# Patient Record
Sex: Female | Born: 1975
Health system: Southern US, Community
[De-identification: ages and names within clinical notes are randomized; demographics above are authoritative.]

## PROBLEM LIST (undated history)

## (undated) DIAGNOSIS — R51 Headache: Secondary | ICD-10-CM

## (undated) DIAGNOSIS — E785 Hyperlipidemia, unspecified: Secondary | ICD-10-CM

## (undated) DIAGNOSIS — E28319 Asymptomatic premature menopause: Secondary | ICD-10-CM

## (undated) HISTORY — PX: BREAST BIOPSY: SHX20

## (undated) HISTORY — DX: Hyperlipidemia, unspecified: E78.5

## (undated) HISTORY — PX: WISDOM TOOTH EXTRACTION: SHX21

## (undated) HISTORY — DX: Asymptomatic premature menopause: E28.319

---

## 2005-11-28 HISTORY — PX: DILATION AND CURETTAGE OF UTERUS: SHX78

## 2011-11-14 ENCOUNTER — Ambulatory Visit (HOSPITAL_COMMUNITY)
Admission: RE | Admit: 2011-11-14 | Discharge: 2011-11-14 | Disposition: A | Discharge status: Home / Self Care | Payer: BC Managed Care – PPO | Source: Ambulatory Visit | Attending: Obstetrics and Gynecology | Admitting: Obstetrics and Gynecology

## 2011-11-14 ENCOUNTER — Other Ambulatory Visit (HOSPITAL_COMMUNITY): Payer: Self-pay | Admitting: Obstetrics and Gynecology

## 2011-11-14 DIAGNOSIS — O021 Missed abortion: Secondary | ICD-10-CM

## 2011-11-15 ENCOUNTER — Encounter (HOSPITAL_COMMUNITY): Payer: Self-pay | Admitting: Pharmacist

## 2011-11-15 ENCOUNTER — Encounter (HOSPITAL_COMMUNITY): Payer: Self-pay | Admitting: *Deleted

## 2011-11-24 ENCOUNTER — Other Ambulatory Visit: Payer: Self-pay | Admitting: Obstetrics and Gynecology

## 2011-11-25 ENCOUNTER — Encounter (HOSPITAL_COMMUNITY): Payer: Self-pay | Admitting: *Deleted

## 2011-11-25 ENCOUNTER — Encounter (HOSPITAL_COMMUNITY): Payer: Self-pay | Admitting: Anesthesiology

## 2011-11-25 ENCOUNTER — Other Ambulatory Visit: Payer: Self-pay | Admitting: Obstetrics and Gynecology

## 2011-11-25 ENCOUNTER — Ambulatory Visit (HOSPITAL_COMMUNITY)
Admission: RE | Admit: 2011-11-25 | Discharge: 2011-11-25 | Disposition: A | Discharge status: Home / Self Care | Payer: BC Managed Care – PPO | Source: Ambulatory Visit | Attending: Obstetrics and Gynecology | Admitting: Obstetrics and Gynecology

## 2011-11-25 ENCOUNTER — Ambulatory Visit (HOSPITAL_COMMUNITY): Payer: BC Managed Care – PPO | Admitting: Anesthesiology

## 2011-11-25 ENCOUNTER — Encounter (HOSPITAL_COMMUNITY)
Admission: RE | Disposition: A | Discharge status: Home / Self Care | Payer: Self-pay | Source: Ambulatory Visit | Attending: Obstetrics and Gynecology

## 2011-11-25 DIAGNOSIS — O021 Missed abortion: Secondary | ICD-10-CM | POA: Insufficient documentation

## 2011-11-25 HISTORY — DX: Headache: R51

## 2011-11-25 HISTORY — PX: DILATION AND EVACUATION: SHX1459

## 2011-11-25 LAB — CBC
HCT: 36.7 % (ref 36.0–46.0)
Hemoglobin: 12.9 g/dL (ref 12.0–15.0)
RDW: 12.4 % (ref 11.5–15.5)
WBC: 8.2 10*3/uL (ref 4.0–10.5)

## 2011-11-25 SURGERY — DILATION AND EVACUATION, UTERUS
Anesthesia: Monitor Anesthesia Care | Wound class: Clean Contaminated

## 2011-11-25 MED ORDER — DOXYCYCLINE HYCLATE 100 MG PO TABS: 100.0000 mg | ORAL_TABLET | Freq: Once | ORAL | Status: DC

## 2011-11-25 MED ORDER — FENTANYL CITRATE 0.05 MG/ML IJ SOLN
INTRAMUSCULAR | Status: DC | PRN
  Administered 2011-11-25 (×2): 50 ug via INTRAVENOUS

## 2011-11-25 MED ORDER — LIDOCAINE HCL (CARDIAC) 20 MG/ML IV SOLN
INTRAVENOUS | Status: DC | PRN
  Administered 2011-11-25: 40 mg via INTRAVENOUS

## 2011-11-25 MED ORDER — FENTANYL CITRATE 0.05 MG/ML IJ SOLN
INTRAMUSCULAR | Status: AC
  Filled 2011-11-25: qty 2

## 2011-11-25 MED ORDER — PROPOFOL 10 MG/ML IV EMUL
INTRAVENOUS | Status: AC
  Filled 2011-11-25: qty 20

## 2011-11-25 MED ORDER — MIDAZOLAM HCL 2 MG/2ML IJ SOLN
INTRAMUSCULAR | Status: AC
  Filled 2011-11-25: qty 2

## 2011-11-25 MED ORDER — LACTATED RINGERS IV SOLN: INTRAVENOUS | Status: DC

## 2011-11-25 MED ORDER — DEXAMETHASONE SODIUM PHOSPHATE 10 MG/ML IJ SOLN
INTRAMUSCULAR | Status: AC
  Filled 2011-11-25: qty 1

## 2011-11-25 MED ORDER — LIDOCAINE HCL (CARDIAC) 20 MG/ML IV SOLN
INTRAVENOUS | Status: AC
  Filled 2011-11-25: qty 5

## 2011-11-25 MED ORDER — MIDAZOLAM HCL 5 MG/5ML IJ SOLN
INTRAMUSCULAR | Status: DC | PRN
  Administered 2011-11-25: 2 mg via INTRAVENOUS

## 2011-11-25 MED ORDER — ONDANSETRON HCL 4 MG/2ML IJ SOLN
INTRAMUSCULAR | Status: DC | PRN
  Administered 2011-11-25: 4 mg via INTRAVENOUS

## 2011-11-25 MED ORDER — PROPOFOL 10 MG/ML IV EMUL
INTRAVENOUS | Status: DC | PRN
  Administered 2011-11-25: 40 mg via INTRAVENOUS
  Administered 2011-11-25: 20 mg via INTRAVENOUS
  Administered 2011-11-25 (×3): 30 mg via INTRAVENOUS
  Administered 2011-11-25: 70 mg via INTRAVENOUS
  Administered 2011-11-25 (×2): 30 mg via INTRAVENOUS
  Administered 2011-11-25: 20 mg via INTRAVENOUS
  Administered 2011-11-25 (×4): 30 mg via INTRAVENOUS
  Administered 2011-11-25: 20 mg via INTRAVENOUS

## 2011-11-25 MED ORDER — LIDOCAINE HCL 1 % IJ SOLN
INTRAMUSCULAR | Status: DC | PRN
  Administered 2011-11-25: 10 mL

## 2011-11-25 MED ORDER — KETOROLAC TROMETHAMINE 30 MG/ML IJ SOLN
INTRAMUSCULAR | Status: DC | PRN
  Administered 2011-11-25: 30 mg via INTRAMUSCULAR

## 2011-11-25 MED ORDER — 0.9 % SODIUM CHLORIDE (POUR BTL) OPTIME
TOPICAL | Status: DC | PRN
  Administered 2011-11-25: 1000 mL

## 2011-11-25 MED ORDER — PROMETHAZINE HCL 25 MG/ML IJ SOLN: 6.2500 mg | INTRAMUSCULAR | Status: DC | PRN

## 2011-11-25 MED ORDER — PROPOFOL 10 MG/ML IV EMUL
INTRAVENOUS | Status: AC
  Filled 2011-11-25: qty 100

## 2011-11-25 MED ORDER — DEXAMETHASONE SODIUM PHOSPHATE 4 MG/ML IJ SOLN
INTRAMUSCULAR | Status: DC | PRN
  Administered 2011-11-25: 10 mg via INTRAVENOUS

## 2011-11-25 MED ORDER — DOXYCYCLINE HYCLATE 100 MG IV SOLR
200.0000 mg | Freq: Once | INTRAVENOUS | Status: AC
  Administered 2011-11-25: 200 mg via INTRAVENOUS
  Filled 2011-11-25: qty 200

## 2011-11-25 MED ORDER — FENTANYL CITRATE 0.05 MG/ML IJ SOLN: 25.0000 ug | INTRAMUSCULAR | Status: DC | PRN

## 2011-11-25 MED ORDER — KETOROLAC TROMETHAMINE 30 MG/ML IJ SOLN
INTRAMUSCULAR | Status: AC
  Filled 2011-11-25: qty 1

## 2011-11-25 MED ORDER — ACETAMINOPHEN 325 MG PO TABS: 325.0000 mg | ORAL_TABLET | ORAL | Status: DC | PRN

## 2011-11-25 MED ORDER — ONDANSETRON HCL 4 MG/2ML IJ SOLN
INTRAMUSCULAR | Status: AC
  Filled 2011-11-25: qty 2

## 2011-11-25 MED ORDER — LACTATED RINGERS IV SOLN
INTRAVENOUS | Status: DC
  Administered 2011-11-25 (×2): via INTRAVENOUS

## 2011-11-25 MED ORDER — KETOROLAC TROMETHAMINE 30 MG/ML IJ SOLN: 15.0000 mg | Freq: Once | INTRAMUSCULAR | Status: DC | PRN

## 2011-11-25 SURGICAL SUPPLY — 23 items
CATH ROBINSON RED A/P 16FR (CATHETERS) ×2 IMPLANT
CLOTH BEACON ORANGE TIMEOUT ST (SAFETY) ×2 IMPLANT
DECANTER SPIKE VIAL GLASS SM (MISCELLANEOUS) ×2 IMPLANT
DRAPE HYSTEROSCOPY (DRAPE) ×2 IMPLANT
GAUZE SPONGE 4X4 16PLY XRAY LF (GAUZE/BANDAGES/DRESSINGS) ×2 IMPLANT
GLOVE BIO SURGEON STRL SZ 6.5 (GLOVE) ×2 IMPLANT
GLOVE BIOGEL PI IND STRL 6.5 (GLOVE) ×2 IMPLANT
GLOVE BIOGEL PI INDICATOR 6.5 (GLOVE) ×2
GLOVE SURG SS PI 7.5 STRL IVOR (GLOVE) ×4 IMPLANT
GOWN PREVENTION PLUS LG XLONG (DISPOSABLE) ×4 IMPLANT
GOWN SURGICAL XLG (GOWNS) ×2 IMPLANT
KIT BERKELEY 1ST TRIMESTER 3/8 (MISCELLANEOUS) ×2 IMPLANT
NEEDLE SPNL 22GX3.5 QUINCKE BK (NEEDLE) ×2 IMPLANT
NS IRRIG 1000ML POUR BTL (IV SOLUTION) ×2 IMPLANT
PACK VAGINAL MINOR WOMEN LF (CUSTOM PROCEDURE TRAY) ×2 IMPLANT
PAD PREP 24X48 CUFFED NSTRL (MISCELLANEOUS) ×2 IMPLANT
SET BERKELEY SUCTION TUBING (SUCTIONS) ×2 IMPLANT
SYR CONTROL 10ML LL (SYRINGE) ×2 IMPLANT
TOWEL OR 17X24 6PK STRL BLUE (TOWEL DISPOSABLE) ×4 IMPLANT
VACURETTE 10 RIGID CVD (CANNULA) IMPLANT
VACURETTE 7MM CVD STRL WRAP (CANNULA) IMPLANT
VACURETTE 8 RIGID CVD (CANNULA) ×2 IMPLANT
VACURETTE 9 RIGID CVD (CANNULA) IMPLANT

## 2011-11-25 NOTE — Anesthesia Preprocedure Evaluation (Signed)
Anesthesia Evaluation  Patient identified by MRN, date of birth, ID band Patient awake    Reviewed: Allergy & Precautions, H&P , Patient's Chart, lab work & pertinent test results, reviewed documented beta blocker date and time   History of Anesthesia Complications Negative for: history of anesthetic complications  Airway Mallampati: II TM Distance: >3 FB Neck ROM: full    Dental No notable dental hx.    Pulmonary neg pulmonary ROS,  clear to auscultation  Pulmonary exam normal       Cardiovascular Exercise Tolerance: Good neg cardio ROS regular Normal    Neuro/Psych Negative Neurological ROS  Negative Psych ROS   GI/Hepatic negative GI ROS, Neg liver ROS,   Endo/Other  Negative Endocrine ROS  Renal/GU negative Renal ROS     Musculoskeletal   Abdominal   Peds  Hematology negative hematology ROS (+)   Anesthesia Other Findings   Reproductive/Obstetrics negative OB ROS                           Anesthesia Physical Anesthesia Plan  ASA: I  Anesthesia Plan: MAC   Post-op Pain Management:    Induction:   Airway Management Planned:   Additional Equipment:   Intra-op Plan:   Post-operative Plan:   Informed Consent: I have reviewed the patients History and Physical, chart, labs and discussed the procedure including the risks, benefits and alternatives for the proposed anesthesia with the patient or authorized representative who has indicated his/her understanding and acceptance.   Dental Advisory Given  Plan Discussed with: CRNA, Surgeon and Anesthesiologist  Anesthesia Plan Comments:         Anesthesia Quick Evaluation  

## 2011-11-25 NOTE — Transfer of Care (Signed)
Immediate Anesthesia Transfer of Care Note  Patient: Kristy Stone  Procedure(s) Performed:  DILATATION AND EVACUATION - dilatation and curettage  Patient Location: PACU  Anesthesia Type: MAC  Level of Consciousness: awake, alert , oriented and patient cooperative  Airway & Oxygen Therapy: Patient Spontanous Breathing and Patient connected to nasal cannula oxygen  Post-op Assessment: Report given to PACU RN and Post -op Vital signs reviewed and stable  Post vital signs: Reviewed and stable  Complications: No apparent anesthesia complications

## 2011-11-25 NOTE — H&P (Signed)
35 y.o. yo complains of MAB ~8 wk, desiring definitive mgmt.  No bleeding.  Past Medical History  Diagnosis Date  . Headache    Past Surgical History  Procedure Date  . Cesarean section x2  . Dilation and curettage of uterus 2007    History   Social History  . Marital Status: Married    Spouse Name: N/A    Number of Children: N/A  . Years of Education: N/A   Occupational History  . Not on file.   Social History Main Topics  . Smoking status: Former Games developer  . Smokeless tobacco: Not on file  . Alcohol Use: Yes  . Drug Use: No  . Sexually Active:    Other Topics Concern  . Not on file   Social History Narrative  . No narrative on file    No current facility-administered medications on file prior to encounter.   No current outpatient prescriptions on file prior to encounter.    No Known Allergies  @VITALS2 @  Lungs: clear to ascultation Cor:  RRR Abdomen:  soft, nontender, nondistended. Ex:  no cords, erythema Pelvic:  Deferred to OR  A:  35yo with MAB   P:  D&E  All risks, benefits and alternatives d/w patient and she desires to proceed with a D&E . Pt to receive 200 IV doxycycline pre-op.  Rx for 100mg  po doxycycline given to be taken 12 hrs post-op.    Philip Aspen

## 2011-11-25 NOTE — Discharge Summary (Signed)
  Admitted for same day procedure. Underwent uncomplicated D&E Rx doxycycline 100 mg po 12 hrs post-op. F/u 2 weeks.

## 2011-11-25 NOTE — Anesthesia Postprocedure Evaluation (Signed)
Anesthesia Post Note  Patient: Kristy Stone  Procedure(s) Performed:  DILATATION AND EVACUATION - dilatation and curettage  Anesthesia type: MAC  Patient location: PACU  Post pain: Pain level controlled  Post assessment: Post-op Vital signs reviewed  Last Vitals:  Filed Vitals:   11/25/11 1210  BP: 95/75  Pulse: 86  Temp: 36.9 C  Resp: 18    Post vital signs: Reviewed  Level of consciousness: sedated  Complications: No apparent anesthesia complications

## 2011-11-25 NOTE — Op Note (Signed)
Preop: 9 wk MAB, no FP Postop: same Procedure: D&E Surgeon: Delray Alt Anethesia: MAC, 10 cc 1% lido Fluids: see Anesth report UOP: cath <200 EBL: 300 Specimen: POC Complications: None Condition: stable to PACU

## 2011-11-25 NOTE — Preoperative (Signed)
Beta Blockers   Reason not to administer Beta Blockers:Not Applicable 

## 2011-11-28 ENCOUNTER — Encounter (HOSPITAL_COMMUNITY): Payer: Self-pay | Admitting: Obstetrics and Gynecology

## 2011-11-29 DEATH — deceased

## 2011-12-03 NOTE — Op Note (Signed)
Kristy Stone, Kristy Stone             ACCOUNT NO.:  [id number]  MEDICAL RECORD NO.:  [id number]  LOCATION:  [facility]                          FACILITY:  [facility]  PHYSICIAN:  Philip Aspen, DO    DATE OF BIRTH:  November 24, 1976  DATE OF PROCEDURE:  12/02/2011 DATE OF DISCHARGE:  11/25/2011                              OPERATIVE REPORT   PREOPERATIVE DIAGNOSIS:  Nine-week missed abortion, no fetal pole.  POSTOPERATIVE DIAGNOSIS:  Nine-week missed abortion, no fetal pole.  PROCEDURE:  Dilation and evacuation.  SURGEON:  Philip Aspen, DO  ANESTHESIA:  MAC, 10 mL of 1% lidocaine.  FLUIDS:  Please seen anesthesia report.  URINE OUTPUT:  Cathed for less than 200 mL.  ESTIMATED BLOOD LOSS:  300 mL.  SPECIMENS:  Products of conception.  COMPLICATION:  None.  CONDITION:  Stable to PACU.  DESCRIPTION OF PROCEDURE:  The patient was taken to the operating room where MAC sedation was administered and found to be adequate.  She was then prepped and draped in a normal sterile fashion in the dorsal lithotomy position.  A weighted speculum was placed in the posterior aspect of the vagina and a single toothed tenaculum was used to grasp the anterior lip of the cervix.  The cervical os was sterilely dilated to approximately 27 Pratt.  A 9 curved curette was gently introduced through the cervical os to the fundus and vacuum was then applied. Three passes were made in the fascia and with return of products of conception.  A vessel at the cervical os intermittently bled briskly during the procedure.  Three passes of a curved curette were performed, sharp curettage.  Remaining pass of the vacuum curette was done with minimal additional bleeding.  The vessel at the external cervix at that point had stopped bleeding.  Good uterine cry had been noted in all quadrants.  Products of conception were sent to the Pathology.  Single tipped tenaculum was removed and cervix was found to be hemostatic.  All other  instruments were removed.  Of note, 10 mL of 1% lidocaine had been administered prior to the start of the procedure for cervical block. The patient tolerated the procedure well.  Sponge, lap, and needle counts were correct x2.  The patient was taken to recovery in stable condition.  The patient did receive 200 mg of IV doxycycline at the start of the procedure and was given a prescription for 100 mg of oral doxycycline to be taken 12 hours after.  The patient was instructed to follow up with physician in 2 weeks or sooner with any issues.          ______________________________ Philip Aspen, DO     [place]/MEDQ  D:  12/02/2011  T:  12/03/2011  Job:  [id number]

## 2012-01-24 ENCOUNTER — Other Ambulatory Visit: Payer: Self-pay

## 2016-09-11 DIAGNOSIS — Z23 Encounter for immunization: Secondary | ICD-10-CM | POA: Diagnosis not present

## 2016-09-19 ENCOUNTER — Other Ambulatory Visit: Payer: Self-pay | Admitting: Obstetrics and Gynecology

## 2016-09-19 DIAGNOSIS — E2839 Other primary ovarian failure: Secondary | ICD-10-CM

## 2016-09-19 DIAGNOSIS — E288 Other ovarian dysfunction: Principal | ICD-10-CM

## 2016-09-27 ENCOUNTER — Ambulatory Visit
Admission: RE | Admit: 2016-09-27 | Discharge: 2016-09-27 | Disposition: A | Discharge status: Home / Self Care | Payer: BLUE CROSS/BLUE SHIELD | Source: Ambulatory Visit | Attending: Obstetrics and Gynecology | Admitting: Obstetrics and Gynecology

## 2016-09-27 DIAGNOSIS — E288 Other ovarian dysfunction: Principal | ICD-10-CM

## 2016-09-27 DIAGNOSIS — Z13820 Encounter for screening for osteoporosis: Secondary | ICD-10-CM | POA: Diagnosis not present

## 2016-09-27 DIAGNOSIS — E2839 Other primary ovarian failure: Secondary | ICD-10-CM

## 2016-09-27 DIAGNOSIS — Z780 Asymptomatic menopausal state: Secondary | ICD-10-CM | POA: Diagnosis not present

## 2017-04-21 DIAGNOSIS — J0100 Acute maxillary sinusitis, unspecified: Secondary | ICD-10-CM | POA: Diagnosis not present

## 2017-08-18 DIAGNOSIS — Z23 Encounter for immunization: Secondary | ICD-10-CM | POA: Diagnosis not present

## 2017-09-09 DIAGNOSIS — N3001 Acute cystitis with hematuria: Secondary | ICD-10-CM | POA: Diagnosis not present

## 2017-09-28 HISTORY — PX: APPENDECTOMY: SHX54

## 2017-10-27 ENCOUNTER — Other Ambulatory Visit: Payer: Self-pay

## 2017-10-27 ENCOUNTER — Emergency Department (HOSPITAL_COMMUNITY): Payer: BLUE CROSS/BLUE SHIELD

## 2017-10-27 ENCOUNTER — Observation Stay (HOSPITAL_COMMUNITY)
Admission: EM | Admit: 2017-10-27 | Discharge: 2017-10-28 | Disposition: A | Discharge status: Home / Self Care | Payer: BLUE CROSS/BLUE SHIELD | Attending: General Surgery | Admitting: General Surgery

## 2017-10-27 ENCOUNTER — Emergency Department (HOSPITAL_COMMUNITY): Payer: BLUE CROSS/BLUE SHIELD | Admitting: Certified Registered Nurse Anesthetist

## 2017-10-27 ENCOUNTER — Encounter (HOSPITAL_COMMUNITY)
Admission: EM | Disposition: A | Discharge status: Home / Self Care | Payer: Self-pay | Source: Home / Self Care | Attending: Emergency Medicine

## 2017-10-27 ENCOUNTER — Encounter (HOSPITAL_COMMUNITY): Payer: Self-pay | Admitting: Emergency Medicine

## 2017-10-27 DIAGNOSIS — R1031 Right lower quadrant pain: Secondary | ICD-10-CM | POA: Diagnosis not present

## 2017-10-27 DIAGNOSIS — Z87891 Personal history of nicotine dependence: Secondary | ICD-10-CM | POA: Insufficient documentation

## 2017-10-27 DIAGNOSIS — K381 Appendicular concretions: Secondary | ICD-10-CM | POA: Insufficient documentation

## 2017-10-27 DIAGNOSIS — K353 Acute appendicitis with localized peritonitis, without perforation or gangrene: Principal | ICD-10-CM | POA: Insufficient documentation

## 2017-10-27 DIAGNOSIS — Z9049 Acquired absence of other specified parts of digestive tract: Secondary | ICD-10-CM

## 2017-10-27 DIAGNOSIS — R109 Unspecified abdominal pain: Secondary | ICD-10-CM | POA: Diagnosis not present

## 2017-10-27 DIAGNOSIS — K3580 Unspecified acute appendicitis: Secondary | ICD-10-CM | POA: Diagnosis not present

## 2017-10-27 DIAGNOSIS — R1030 Lower abdominal pain, unspecified: Secondary | ICD-10-CM | POA: Diagnosis not present

## 2017-10-27 DIAGNOSIS — R1110 Vomiting, unspecified: Secondary | ICD-10-CM | POA: Diagnosis not present

## 2017-10-27 HISTORY — PX: LAPAROSCOPIC APPENDECTOMY: SHX408

## 2017-10-27 LAB — CBC
HEMATOCRIT: 36.6 % (ref 36.0–46.0)
Hemoglobin: 12.6 g/dL (ref 12.0–15.0)
MCH: 31.3 pg (ref 26.0–34.0)
MCHC: 34.4 g/dL (ref 30.0–36.0)
MCV: 90.8 fL (ref 78.0–100.0)
PLATELETS: 240 10*3/uL (ref 150–400)
RBC: 4.03 MIL/uL (ref 3.87–5.11)
RDW: 11.8 % (ref 11.5–15.5)
WBC: 13.8 10*3/uL — AB (ref 4.0–10.5)

## 2017-10-27 LAB — WET PREP, GENITAL
Clue Cells Wet Prep HPF POC: NONE SEEN
Sperm: NONE SEEN
TRICH WET PREP: NONE SEEN
YEAST WET PREP: NONE SEEN

## 2017-10-27 LAB — COMPREHENSIVE METABOLIC PANEL
ALT: 28 U/L (ref 14–54)
AST: 21 U/L (ref 15–41)
Albumin: 4.1 g/dL (ref 3.5–5.0)
Alkaline Phosphatase: 56 U/L (ref 38–126)
Anion gap: 8 (ref 5–15)
BILIRUBIN TOTAL: 0.7 mg/dL (ref 0.3–1.2)
BUN: 12 mg/dL (ref 6–20)
CHLORIDE: 102 mmol/L (ref 101–111)
CO2: 24 mmol/L (ref 22–32)
CREATININE: 0.73 mg/dL (ref 0.44–1.00)
Calcium: 9.2 mg/dL (ref 8.9–10.3)
Glucose, Bld: 137 mg/dL — ABNORMAL HIGH (ref 65–99)
POTASSIUM: 3.7 mmol/L (ref 3.5–5.1)
Sodium: 134 mmol/L — ABNORMAL LOW (ref 135–145)
TOTAL PROTEIN: 6.5 g/dL (ref 6.5–8.1)

## 2017-10-27 LAB — I-STAT BETA HCG BLOOD, ED (MC, WL, AP ONLY): I-stat hCG, quantitative: <5 m[IU]/mL (ref <5)

## 2017-10-27 LAB — URINALYSIS, ROUTINE W REFLEX MICROSCOPIC
Bilirubin Urine: NEGATIVE
Glucose, UA: NEGATIVE mg/dL
Hgb urine dipstick: NEGATIVE
KETONES UR: 20 mg/dL — AB
LEUKOCYTES UA: NEGATIVE
NITRITE: NEGATIVE
PROTEIN: NEGATIVE mg/dL
Specific Gravity, Urine: 1.019 (ref 1.005–1.030)
pH: 6 (ref 5.0–8.0)

## 2017-10-27 LAB — LIPASE, BLOOD: LIPASE: 22 U/L (ref 11–51)

## 2017-10-27 SURGERY — APPENDECTOMY, LAPAROSCOPIC
Anesthesia: General | Site: Abdomen

## 2017-10-27 MED ORDER — KETOROLAC TROMETHAMINE 30 MG/ML IJ SOLN: 30.0000 mg | Freq: Four times a day (QID) | INTRAMUSCULAR | Status: DC | PRN

## 2017-10-27 MED ORDER — SUGAMMADEX SODIUM 200 MG/2ML IV SOLN
INTRAVENOUS | Status: DC | PRN
  Administered 2017-10-27: 150 mg via INTRAVENOUS

## 2017-10-27 MED ORDER — MORPHINE SULFATE (PF) 4 MG/ML IV SOLN
4.0000 mg | Freq: Once | INTRAVENOUS | Status: AC
  Administered 2017-10-27: 4 mg via INTRAVENOUS
  Filled 2017-10-27: qty 1

## 2017-10-27 MED ORDER — DEXTROSE-NACL 5-0.9 % IV SOLN
INTRAVENOUS | Status: DC
  Administered 2017-10-27: 20:00:00 via INTRAVENOUS

## 2017-10-27 MED ORDER — HYDROMORPHONE HCL 1 MG/ML IJ SOLN: 1.0000 mg | INTRAMUSCULAR | Status: DC | PRN

## 2017-10-27 MED ORDER — ROCURONIUM BROMIDE 10 MG/ML (PF) SYRINGE
PREFILLED_SYRINGE | INTRAVENOUS | Status: AC
  Filled 2017-10-27: qty 5

## 2017-10-27 MED ORDER — PHENYLEPHRINE 40 MCG/ML (10ML) SYRINGE FOR IV PUSH (FOR BLOOD PRESSURE SUPPORT)
PREFILLED_SYRINGE | INTRAVENOUS | Status: AC
  Filled 2017-10-27: qty 10

## 2017-10-27 MED ORDER — IOPAMIDOL (ISOVUE-300) INJECTION 61%
INTRAVENOUS | Status: AC
  Administered 2017-10-27: 100 mL
  Filled 2017-10-27: qty 100

## 2017-10-27 MED ORDER — PROMETHAZINE HCL 25 MG/ML IJ SOLN: 6.2500 mg | INTRAMUSCULAR | Status: DC | PRN

## 2017-10-27 MED ORDER — SODIUM CHLORIDE 0.9 % IV BOLUS (SEPSIS)
1000.0000 mL | Freq: Once | INTRAVENOUS | Status: AC
Start: 2017-10-27 — End: 2017-10-27
  Administered 2017-10-27: 1000 mL via INTRAVENOUS

## 2017-10-27 MED ORDER — SCOPOLAMINE 1 MG/3DAYS TD PT72
MEDICATED_PATCH | TRANSDERMAL | Status: DC | PRN
  Administered 2017-10-27: 1 via TRANSDERMAL

## 2017-10-27 MED ORDER — PIPERACILLIN-TAZOBACTAM 3.375 G IVPB 30 MIN
3.3750 g | Freq: Once | INTRAVENOUS | Status: AC
  Administered 2017-10-27: 3.375 g via INTRAVENOUS
  Filled 2017-10-27: qty 50

## 2017-10-27 MED ORDER — GLYCOPYRROLATE 0.2 MG/ML IJ SOLN
INTRAMUSCULAR | Status: DC | PRN
  Administered 2017-10-27: 0.2 mg via INTRAVENOUS

## 2017-10-27 MED ORDER — PROPOFOL 10 MG/ML IV BOLUS
INTRAVENOUS | Status: DC | PRN
  Administered 2017-10-27: 150 mg via INTRAVENOUS

## 2017-10-27 MED ORDER — FENTANYL CITRATE (PF) 100 MCG/2ML IJ SOLN
INTRAMUSCULAR | Status: DC | PRN
  Administered 2017-10-27 (×2): 50 ug via INTRAVENOUS
  Administered 2017-10-27: 100 ug via INTRAVENOUS

## 2017-10-27 MED ORDER — ONDANSETRON HCL 4 MG/2ML IJ SOLN: 4.0000 mg | Freq: Four times a day (QID) | INTRAMUSCULAR | Status: DC | PRN

## 2017-10-27 MED ORDER — OXYCODONE HCL 5 MG PO TABS: 5.0000 mg | ORAL_TABLET | ORAL | Status: DC | PRN

## 2017-10-27 MED ORDER — BUPIVACAINE HCL (PF) 0.25 % IJ SOLN
INTRAMUSCULAR | Status: AC
  Filled 2017-10-27: qty 20

## 2017-10-27 MED ORDER — ROCURONIUM BROMIDE 100 MG/10ML IV SOLN
INTRAVENOUS | Status: DC | PRN
  Administered 2017-10-27: 30 mg via INTRAVENOUS

## 2017-10-27 MED ORDER — ONDANSETRON 4 MG PO TBDP
4.0000 mg | ORAL_TABLET | Freq: Once | ORAL | Status: AC | PRN
  Administered 2017-10-27: 4 mg via ORAL
  Filled 2017-10-27: qty 1

## 2017-10-27 MED ORDER — 0.9 % SODIUM CHLORIDE (POUR BTL) OPTIME
TOPICAL | Status: DC | PRN
  Administered 2017-10-27 (×2): 1000 mL

## 2017-10-27 MED ORDER — MIDAZOLAM HCL 2 MG/2ML IJ SOLN
INTRAMUSCULAR | Status: AC
  Filled 2017-10-27: qty 2

## 2017-10-27 MED ORDER — LACTATED RINGERS IV SOLN: INTRAVENOUS | Status: DC

## 2017-10-27 MED ORDER — MEPERIDINE HCL 25 MG/ML IJ SOLN: 6.2500 mg | INTRAMUSCULAR | Status: DC | PRN

## 2017-10-27 MED ORDER — LIDOCAINE HCL (CARDIAC) 20 MG/ML IV SOLN
INTRAVENOUS | Status: DC | PRN
Start: 2017-10-27 — End: 2017-10-27
  Administered 2017-10-27: 60 mg via INTRAVENOUS

## 2017-10-27 MED ORDER — ONDANSETRON 4 MG PO TBDP: 4.0000 mg | ORAL_TABLET | Freq: Four times a day (QID) | ORAL | Status: DC | PRN

## 2017-10-27 MED ORDER — FENTANYL CITRATE (PF) 250 MCG/5ML IJ SOLN
INTRAMUSCULAR | Status: AC
  Filled 2017-10-27: qty 5

## 2017-10-27 MED ORDER — LACTATED RINGERS IV SOLN
INTRAVENOUS | Status: DC
  Administered 2017-10-27: 18:00:00 via INTRAVENOUS

## 2017-10-27 MED ORDER — SUCCINYLCHOLINE CHLORIDE 20 MG/ML IJ SOLN
INTRAMUSCULAR | Status: DC | PRN
  Administered 2017-10-27: 4 mg via INTRAVENOUS
  Administered 2017-10-27: 80 mg via INTRAVENOUS

## 2017-10-27 MED ORDER — MIDAZOLAM HCL 5 MG/5ML IJ SOLN
INTRAMUSCULAR | Status: DC | PRN
  Administered 2017-10-27: 2 mg via INTRAVENOUS

## 2017-10-27 MED ORDER — LACTATED RINGERS IV SOLN
INTRAVENOUS | Status: DC | PRN
  Administered 2017-10-27: 18:00:00 via INTRAVENOUS

## 2017-10-27 MED ORDER — SUCCINYLCHOLINE CHLORIDE 200 MG/10ML IV SOSY
PREFILLED_SYRINGE | INTRAVENOUS | Status: AC
  Filled 2017-10-27: qty 10

## 2017-10-27 MED ORDER — SODIUM CHLORIDE 0.9 % IR SOLN
Status: DC | PRN
  Administered 2017-10-27: 1000 mL

## 2017-10-27 MED ORDER — DEXAMETHASONE SODIUM PHOSPHATE 10 MG/ML IJ SOLN
INTRAMUSCULAR | Status: AC
  Filled 2017-10-27: qty 1

## 2017-10-27 MED ORDER — SUGAMMADEX SODIUM 200 MG/2ML IV SOLN
INTRAVENOUS | Status: AC
  Filled 2017-10-27: qty 4

## 2017-10-27 MED ORDER — DEXAMETHASONE SODIUM PHOSPHATE 4 MG/ML IJ SOLN
INTRAMUSCULAR | Status: DC | PRN
  Administered 2017-10-27: 8 mg via INTRAVENOUS

## 2017-10-27 MED ORDER — PROPOFOL 10 MG/ML IV BOLUS
INTRAVENOUS | Status: AC
  Filled 2017-10-27: qty 20

## 2017-10-27 MED ORDER — BUPIVACAINE HCL 0.25 % IJ SOLN
INTRAMUSCULAR | Status: DC | PRN
Start: 2017-10-27 — End: 2017-10-27
  Administered 2017-10-27: 2 mL

## 2017-10-27 MED ORDER — HYDROMORPHONE HCL 1 MG/ML IJ SOLN: 0.2500 mg | INTRAMUSCULAR | Status: DC | PRN

## 2017-10-27 MED ORDER — ONDANSETRON HCL 4 MG/2ML IJ SOLN
4.0000 mg | Freq: Once | INTRAMUSCULAR | Status: AC
  Administered 2017-10-27: 4 mg via INTRAVENOUS
  Filled 2017-10-27: qty 2

## 2017-10-27 MED ORDER — KETOROLAC TROMETHAMINE 30 MG/ML IJ SOLN
30.0000 mg | Freq: Four times a day (QID) | INTRAMUSCULAR | Status: DC
  Administered 2017-10-27 – 2017-10-28 (×2): 30 mg via INTRAVENOUS
  Filled 2017-10-27 (×2): qty 1

## 2017-10-27 SURGICAL SUPPLY — 47 items
APPLIER CLIP 5 13 M/L LIGAMAX5 (MISCELLANEOUS)
BENZOIN TINCTURE PRP APPL 2/3 (GAUZE/BANDAGES/DRESSINGS) ×3 IMPLANT
BLADE CLIPPER SURG (BLADE) IMPLANT
CANISTER SUCT 3000ML PPV (MISCELLANEOUS) ×3 IMPLANT
CHLORAPREP W/TINT 26ML (MISCELLANEOUS) ×3 IMPLANT
CLIP APPLIE 5 13 M/L LIGAMAX5 (MISCELLANEOUS) IMPLANT
CLOSURE WOUND 1/2 X4 (GAUZE/BANDAGES/DRESSINGS) ×1
CONT SPEC 4OZ CLIKSEAL STRL BL (MISCELLANEOUS) ×3 IMPLANT
COVER SURGICAL LIGHT HANDLE (MISCELLANEOUS) ×3 IMPLANT
COVER TRANSDUCER ULTRASND (DRAPES) ×6 IMPLANT
ELECT REM PT RETURN 9FT ADLT (ELECTROSURGICAL) ×3
ELECTRODE REM PT RTRN 9FT ADLT (ELECTROSURGICAL) ×1 IMPLANT
ENDOLOOP SUT PDS II  0 18 (SUTURE) ×12
ENDOLOOP SUT PDS II 0 18 (SUTURE) ×6 IMPLANT
GAUZE SPONGE 2X2 8PLY STRL LF (GAUZE/BANDAGES/DRESSINGS) ×1 IMPLANT
GLOVE BIO SURGEON STRL SZ 6.5 (GLOVE) ×2 IMPLANT
GLOVE BIO SURGEON STRL SZ7.5 (GLOVE) ×3 IMPLANT
GLOVE BIO SURGEONS STRL SZ 6.5 (GLOVE) ×1
GLOVE BIOGEL PI IND STRL 6.5 (GLOVE) ×1 IMPLANT
GLOVE BIOGEL PI INDICATOR 6.5 (GLOVE) ×2
GLOVE SURG SS PI 6.5 STRL IVOR (GLOVE) ×3 IMPLANT
GOWN STRL REUS W/ TWL LRG LVL3 (GOWN DISPOSABLE) ×2 IMPLANT
GOWN STRL REUS W/ TWL XL LVL3 (GOWN DISPOSABLE) ×1 IMPLANT
GOWN STRL REUS W/TWL LRG LVL3 (GOWN DISPOSABLE) ×4
GOWN STRL REUS W/TWL XL LVL3 (GOWN DISPOSABLE) ×2
GRASPER SUT TROCAR 14GX15 (MISCELLANEOUS) ×3 IMPLANT
KIT BASIN OR (CUSTOM PROCEDURE TRAY) ×3 IMPLANT
KIT ROOM TURNOVER OR (KITS) ×3 IMPLANT
NEEDLE INSUFFLATION 14GA 120MM (NEEDLE) ×3 IMPLANT
NS IRRIG 1000ML POUR BTL (IV SOLUTION) ×3 IMPLANT
PAD ARMBOARD 7.5X6 YLW CONV (MISCELLANEOUS) ×6 IMPLANT
POUCH RETRIEVAL ECOSAC 10 (ENDOMECHANICALS) ×1 IMPLANT
POUCH RETRIEVAL ECOSAC 10MM (ENDOMECHANICALS) ×2
SCISSORS LAP 5X35 DISP (ENDOMECHANICALS) ×3 IMPLANT
SET IRRIG TUBING LAPAROSCOPIC (IRRIGATION / IRRIGATOR) ×3 IMPLANT
SLEEVE ENDOPATH XCEL 5M (ENDOMECHANICALS) ×6 IMPLANT
SPECIMEN JAR SMALL (MISCELLANEOUS) ×3 IMPLANT
SPONGE GAUZE 2X2 STER 10/PKG (GAUZE/BANDAGES/DRESSINGS) ×2
STRIP CLOSURE SKIN 1/2X4 (GAUZE/BANDAGES/DRESSINGS) ×2 IMPLANT
SUT MNCRL AB 4-0 PS2 18 (SUTURE) ×3 IMPLANT
TOWEL OR 17X24 6PK STRL BLUE (TOWEL DISPOSABLE) ×3 IMPLANT
TOWEL OR 17X26 10 PK STRL BLUE (TOWEL DISPOSABLE) ×3 IMPLANT
TRAY FOLEY CATH SILVER 16FR (SET/KITS/TRAYS/PACK) ×3 IMPLANT
TRAY LAPAROSCOPIC MC (CUSTOM PROCEDURE TRAY) ×3 IMPLANT
TROCAR XCEL NON-BLD 11X100MML (ENDOMECHANICALS) ×3 IMPLANT
TROCAR XCEL NON-BLD 5MMX100MML (ENDOMECHANICALS) ×3 IMPLANT
TUBING INSUFFLATION (TUBING) ×3 IMPLANT

## 2017-10-27 NOTE — H&P (Signed)
Kristy Stone is an 41 y.o. female.   Chief Complaint: Abdominal pain HPI: Patient is a 41 year old female with generalized pain that began at 2 AM. She states that the pain continued to increase. She states that the pain slowly localized to the right lower quadrant. She states that she also had some nausea, vomiting.  She states that she also had some diarrhea at the time however this has resolved.  Patient had continued increased abdominal pain and thus presented to the ER for further evaluation.  Upon evaluation in ER she underwent CT scan which shows signs consistent with acute appendicitis.  I reviewed the studies myself.  Patient also had laboratory studies with elevated WBC count.  General surgery was consulted for further evaluation and management. Past Medical History:  Diagnosis Date  . [id number](932.6)     Past Surgical History:  Procedure Laterality Date  . CESAREAN SECTION  x2  . DILATION AND CURETTAGE OF UTERUS  2007  . DILATION AND EVACUATION  11/25/2011   Procedure: DILATATION AND EVACUATION;  Surgeon: Allyn Kenner, DO;  Location: [facility];  Service: Gynecology;  Laterality: N/A;  dilatation and curettage    History reviewed. No pertinent family history. Social History:  reports that she has quit smoking. she has never used smokeless tobacco. She reports that she drinks alcohol. She reports that she does not use drugs.  Allergies: No Known Allergies   (Not in a hospital admission)  Results for orders placed or performed during the hospital encounter of 10/27/17 (from the past 48 hour(s))  Lipase, blood     Status: None   Collection Time: 10/27/17 11:04 AM  Result Value Ref Range   Lipase 22 11 - 51 U/L  Comprehensive metabolic panel     Status: Abnormal   Collection Time: 10/27/17 11:04 AM  Result Value Ref Range   Sodium 134 (L) 135 - 145 mmol/L   Potassium 3.7 3.5 - 5.1 mmol/L   Chloride 102 101 - 111 mmol/L   CO2 24 22 - 32 mmol/L   Glucose, Bld 137 (H) 65  - 99 mg/dL   BUN 12 6 - 20 mg/dL   Creatinine, Ser 0.73 0.44 - 1.00 mg/dL   Calcium 9.2 8.9 - 10.3 mg/dL   Total Protein 6.5 6.5 - 8.1 g/dL   Albumin 4.1 3.5 - 5.0 g/dL   AST 21 15 - 41 U/L   ALT 28 14 - 54 U/L   Alkaline Phosphatase 56 38 - 126 U/L   Total Bilirubin 0.7 0.3 - 1.2 mg/dL   GFR calc non Af Amer >60 >60 mL/min   GFR calc Af Amer >60 >60 mL/min    Comment: (NOTE) The eGFR has been calculated using the CKD EPI equation. This calculation has not been validated in all clinical situations. eGFR's persistently <60 mL/min signify possible Chronic Kidney Disease.    Anion gap 8 5 - 15  CBC     Status: Abnormal   Collection Time: 10/27/17 11:04 AM  Result Value Ref Range   WBC 13.8 (H) 4.0 - 10.5 K/uL   RBC 4.03 3.87 - 5.11 MIL/uL   Hemoglobin 12.6 12.0 - 15.0 g/dL   HCT 36.6 36.0 - 46.0 %   MCV 90.8 78.0 - 100.0 fL   MCH 31.3 26.0 - 34.0 pg   MCHC 34.4 30.0 - 36.0 g/dL   RDW 11.8 11.5 - 15.5 %   Platelets 240 150 - 400 K/uL  I-Stat beta hCG blood, ED  Status: None   Collection Time: 10/27/17 11:20 AM  Result Value Ref Range   I-stat hCG, quantitative <5.0 <5 mIU/mL   Comment 3            Comment:   GEST. AGE      CONC.  (mIU/mL)   <=1 WEEK        5 - 50     2 WEEKS       50 - 500     3 WEEKS       100 - 10,000     4 WEEKS     1,000 - 30,000        FEMALE AND NON-PREGNANT FEMALE:     LESS THAN 5 mIU/mL   Wet prep, genital     Status: Abnormal   Collection Time: 10/27/17  2:44 PM  Result Value Ref Range   Yeast Wet Prep HPF POC NONE SEEN NONE SEEN   Trich, Wet Prep NONE SEEN NONE SEEN   Clue Cells Wet Prep HPF POC NONE SEEN NONE SEEN   WBC, Wet Prep HPF POC FEW (A) NONE SEEN   Sperm NONE SEEN   Urinalysis, Routine w reflex microscopic     Status: Abnormal   Collection Time: 10/27/17  2:48 PM  Result Value Ref Range   Color, Urine YELLOW YELLOW   APPearance CLEAR CLEAR   Specific Gravity, Urine 1.019 1.005 - 1.030   pH 6.0 5.0 - 8.0   Glucose, UA  NEGATIVE NEGATIVE mg/dL   Hgb urine dipstick NEGATIVE NEGATIVE   Bilirubin Urine NEGATIVE NEGATIVE   Ketones, ur 20 (A) NEGATIVE mg/dL   Protein, ur NEGATIVE NEGATIVE mg/dL   Nitrite NEGATIVE NEGATIVE   Leukocytes, UA NEGATIVE NEGATIVE   US Transvaginal Non-ob  Result Date: 10/27/2017 CLINICAL DATA:  Right lower quadrant pain EXAM: TRANSABDOMINAL AND TRANSVAGINAL ULTRASOUND OF PELVIS DOPPLER ULTRASOUND OF OVARIES TECHNIQUE: Both transabdominal and transvaginal ultrasound examinations of the pelvis were performed. Transabdominal technique was performed for global imaging of the pelvis including uterus, ovaries, adnexal regions, and pelvic cul-de-sac. It was necessary to proceed with endovaginal exam following the transabdominal exam to visualize the ovaries. Color and duplex Doppler ultrasound was utilized to evaluate blood flow to the ovaries. COMPARISON:  None. FINDINGS: Uterus Measurements: 6.3 x 2.9 x 4.3 cm. No fibroids or other mass visualized. Endometrium Thickness: 1 mm in thickness.  No focal abnormality visualized. Right ovary Measurements: 2.2 x 0.8 x 1.6 cm. Normal appearance. Prominent tubular structures in the right adnexa are dilated pelvic veins. Left ovary Measurements: 1.9 x 1.2 x 1.5 cm. Normal appearance/no adnexal mass. Pulsed Doppler evaluation of both ovaries demonstrates normal low-resistance arterial and venous waveforms. Other findings No abnormal free fluid. IMPRESSION: No evidence of ovarian torsion or ovarian abnormality. Dilated pelvic veins in the right adnexal region compatible with parapelvic varicosities. Electronically Signed   By: Rolm Baptise M.D.   On: 10/27/2017 16:15   US Pelvis Complete  Result Date: 10/27/2017 CLINICAL DATA:  Right lower quadrant pain EXAM: TRANSABDOMINAL AND TRANSVAGINAL ULTRASOUND OF PELVIS DOPPLER ULTRASOUND OF OVARIES TECHNIQUE: Both transabdominal and transvaginal ultrasound examinations of the pelvis were performed. Transabdominal  technique was performed for global imaging of the pelvis including uterus, ovaries, adnexal regions, and pelvic cul-de-sac. It was necessary to proceed with endovaginal exam following the transabdominal exam to visualize the ovaries. Color and duplex Doppler ultrasound was utilized to evaluate blood flow to the ovaries. COMPARISON:  None. FINDINGS: Uterus Measurements: 6.3 x 2.9  x 4.3 cm. No fibroids or other mass visualized. Endometrium Thickness: 1 mm in thickness.  No focal abnormality visualized. Right ovary Measurements: 2.2 x 0.8 x 1.6 cm. Normal appearance. Prominent tubular structures in the right adnexa are dilated pelvic veins. Left ovary Measurements: 1.9 x 1.2 x 1.5 cm. Normal appearance/no adnexal mass. Pulsed Doppler evaluation of both ovaries demonstrates normal low-resistance arterial and venous waveforms. Other findings No abnormal free fluid. IMPRESSION: No evidence of ovarian torsion or ovarian abnormality. Dilated pelvic veins in the right adnexal region compatible with parapelvic varicosities. Electronically Signed   By: Rolm Baptise M.D.   On: 10/27/2017 16:15   Ct Abdomen Pelvis W Contrast  Result Date: 10/27/2017 CLINICAL DATA:  Suprapubic and right lower quadrant abdominal pain since 2 a.m. this morning with vomiting. EXAM: CT ABDOMEN AND PELVIS WITH CONTRAST TECHNIQUE: Multidetector CT imaging of the abdomen and pelvis was performed using the standard protocol following bolus administration of intravenous contrast. CONTRAST:  131m ISOVUE-300 IOPAMIDOL (ISOVUE-300) INJECTION 61% COMPARISON:  Pelvic sonogram from earlier today. FINDINGS: Lower chest: No significant pulmonary nodules or acute consolidative airspace disease. Hepatobiliary: Normal liver with no liver mass. Normal gallbladder with no radiopaque cholelithiasis. No biliary ductal dilatation. Pancreas: Normal, with no mass or duct dilation. Spleen: Normal size. No mass. Adrenals/Urinary Tract: Normal adrenals. Normal kidneys  with no hydronephrosis and no renal mass. Normal bladder. Stomach/Bowel: Grossly normal stomach. Normal caliber small bowel with no small bowel wall thickening. Diffusely dilated appendix up to 11 mm diameter. Multiple calcified appendicoliths, including a 6 mm appendicolith at the cecal base and a 14 mm appendicolith in the distal appendix. Borderline mild appendiceal wall thickening and periappendiceal fat haziness. Findings are compatible with acute appendicitis. Appendix: Location: Right lower quadrant/right pelvis (abutting the right tube and right ovary) Diameter: 11 mm Appendicolith: Present Mucosal hyper-enhancement: Mild Extraluminal gas: None Periappendiceal collection: None Normal large bowel with no diverticulosis, large bowel wall thickening or pericolonic fat stranding. Vascular/Lymphatic: Normal caliber abdominal aorta. Patent portal, splenic, hepatic and renal veins. No pathologically enlarged lymph nodes in the abdomen or pelvis. Reproductive: Grossly normal uterus.  No adnexal mass. Other: No pneumoperitoneum, ascites or focal fluid collection. Musculoskeletal: No aggressive appearing focal osseous lesions. IMPRESSION: Acute appendicitis with appendicoliths.  No perforation or abscess. These results were called by telephone at the time of interpretation on 10/27/2017 at 4:41 pm to Dr. SBilly Fischer who verbally acknowledged these results. Electronically Signed   By: JIlona SorrelM.D.   On: 10/27/2017 16:45   [place]Art/ven Flow Abd Pelv Doppler  Result Date: 10/27/2017 CLINICAL DATA:  Right lower quadrant pain EXAM: TRANSABDOMINAL AND TRANSVAGINAL ULTRASOUND OF PELVIS DOPPLER ULTRASOUND OF OVARIES TECHNIQUE: Both transabdominal and transvaginal ultrasound examinations of the pelvis were performed. Transabdominal technique was performed for global imaging of the pelvis including uterus, ovaries, adnexal regions, and pelvic cul-de-sac. It was necessary to proceed with endovaginal exam following the  transabdominal exam to visualize the ovaries. Color and duplex Doppler ultrasound was utilized to evaluate blood flow to the ovaries. COMPARISON:  None. FINDINGS: Uterus Measurements: 6.3 x 2.9 x 4.3 cm. No fibroids or other mass visualized. Endometrium Thickness: 1 mm in thickness.  No focal abnormality visualized. Right ovary Measurements: 2.2 x 0.8 x 1.6 cm. Normal appearance. Prominent tubular structures in the right adnexa are dilated pelvic veins. Left ovary Measurements: 1.9 x 1.2 x 1.5 cm. Normal appearance/no adnexal mass. Pulsed Doppler evaluation of both ovaries demonstrates normal low-resistance arterial and venous waveforms. Other findings  No abnormal free fluid. IMPRESSION: No evidence of ovarian torsion or ovarian abnormality. Dilated pelvic veins in the right adnexal region compatible with parapelvic varicosities. Electronically Signed   By: Rolm Baptise M.D.   On: 10/27/2017 16:15    Review of Systems  Constitutional: Negative for chills, fever and malaise/fatigue.  HENT: Negative for ear discharge, hearing loss and sore throat.   Eyes: Negative for blurred vision and discharge.  Respiratory: Negative for cough and shortness of breath.   Cardiovascular: Negative for chest pain, orthopnea and leg swelling.  Gastrointestinal: Positive for abdominal pain, nausea and vomiting. Negative for constipation, diarrhea and heartburn.  Musculoskeletal: Negative for myalgias and neck pain.  Skin: Negative for itching and rash.  Neurological: Negative for dizziness, focal weakness, seizures and loss of consciousness.  Endo/Heme/Allergies: Negative for environmental allergies. Does not bruise/bleed easily.  Psychiatric/Behavioral: Negative for depression and suicidal ideas.  All other systems reviewed and are negative.   Blood pressure 118/73, pulse 89, temperature 97.6 F (36.4 C), temperature source Oral, resp. rate 16, height '5\' 3"'$  (1.6 m), weight 64.4 kg (142 lb), SpO2 100 %. Physical Exam   Constitutional: She is oriented to person, place, and time. Vital signs are normal. She appears well-developed and well-nourished.  Conversant No acute distress  HENT:  Head: Normocephalic and atraumatic.  Eyes: Lids are normal. No scleral icterus.  No lid lag Moist conjunctiva  Neck: Normal range of motion. Neck supple. No tracheal tenderness present. No thyromegaly present.  No cervical lymphadenopathy  Cardiovascular: Normal rate, regular rhythm and intact distal pulses.  No murmur heard. Respiratory: Effort normal and breath sounds normal. She has no wheezes. She has no rales.  GI: Soft. She exhibits no distension and no mass. There is no hepatosplenomegaly. There is tenderness (rlq). There is no rebound and no guarding. No hernia.  Neurological: She is alert and oriented to person, place, and time.  Normal gait and station  Skin: Skin is warm. No rash noted. No cyanosis. Nails show no clubbing.  Normal skin turgor  Psychiatric: Judgment normal.  Appropriate affect     Assessment/Plan 41 year old female with acute appendicitis  1.  We will proceed to the operating room for removal of appendix 2. I discussed with the patient the risks benefits of the procedure to include but not limited to: Infection, bleeding, damage to surrounding structures, possible ileus, possible postoperative infection. Patient voiced understanding and wishes to proceed.    Reyes Ivan, MD 10/27/2017, 5:49 PM

## 2017-10-27 NOTE — ED Triage Notes (Signed)
Sent from [facility] with severe lower abd pain, 5 normal bowel movements since 2am, vomited this am, had labs drawn at office-- WBC 15.2

## 2017-10-27 NOTE — Transfer of Care (Signed)
Immediate Anesthesia Transfer of Care Note  Patient: Kristy Stone  Procedure(s) Performed: APPENDECTOMY LAPAROSCOPIC (N/A Abdomen)  Patient Location: PACU  Anesthesia Type:General  Level of Consciousness: awake, alert , oriented and patient cooperative  Airway & Oxygen Therapy: Patient Spontanous Breathing  Post-op Assessment: Report given to RN and Post -op Vital signs reviewed and stable  Post vital signs: Reviewed and stable  Last Vitals:  Vitals:   10/27/17 1708 10/27/17 1905  BP: 118/73   Pulse: 89   Resp:    Temp:  36.9 C  SpO2: 100%     Last Pain:  Vitals:   10/27/17 1711  TempSrc:   PainSc: 0-No pain         Complications: No apparent anesthesia complications

## 2017-10-27 NOTE — Anesthesia Preprocedure Evaluation (Addendum)
Anesthesia Evaluation  Patient identified by MRN, date of birth, ID band Patient awake    Reviewed: Allergy & Precautions, NPO status , Patient's Chart, lab work & pertinent test results  Airway Mallampati: I  TM Distance: >3 FB Neck ROM: Full    Dental  (+) Teeth Intact, Dental Advisory Given   Pulmonary former smoker,    Pulmonary exam normal breath sounds clear to auscultation       Cardiovascular Exercise Tolerance: Good negative cardio ROS Normal cardiovascular exam Rhythm:Regular Rate:Normal     Neuro/Psych  Headaches, negative psych ROS   GI/Hepatic Neg liver ROS, Acute appendicitis   Endo/Other  negative endocrine ROS  Renal/GU negative Renal ROS     Musculoskeletal negative musculoskeletal ROS (+)   Abdominal   Peds  Hematology negative hematology ROS (+)   Anesthesia Other Findings Day of surgery medications reviewed with the patient.  Reproductive/Obstetrics negative OB ROS                            Anesthesia Physical Anesthesia Plan  ASA: I and emergent  Anesthesia Plan: General   Post-op Pain Management:    Induction: Intravenous, Rapid sequence and Cricoid pressure planned  PONV Risk Score and Plan: 4 or greater and Ondansetron, Dexamethasone, Midazolam and Scopolamine patch - Pre-op  Airway Management Planned: Oral ETT  Additional Equipment: None  Intra-op Plan:   Post-operative Plan: Extubation in OR  Informed Consent: I have reviewed the patients History and Physical, chart, labs and discussed the procedure including the risks, benefits and alternatives for the proposed anesthesia with the patient or authorized representative who has indicated his/her understanding and acceptance.   Dental advisory given  Plan Discussed with: CRNA  Anesthesia Plan Comments:        Anesthesia Quick Evaluation

## 2017-10-27 NOTE — ED Notes (Signed)
Patient transported to Ultrasound 

## 2017-10-27 NOTE — Anesthesia Postprocedure Evaluation (Signed)
Anesthesia Post Note  Patient: Kristy Stone  Procedure(s) Performed: APPENDECTOMY LAPAROSCOPIC (N/A Abdomen)     Patient location during evaluation: PACU Anesthesia Type: General Level of consciousness: awake and alert Pain management: pain level controlled Vital Signs Assessment: post-procedure vital signs reviewed and stable Respiratory status: spontaneous breathing, nonlabored ventilation and respiratory function stable Cardiovascular status: blood pressure returned to baseline and stable Postop Assessment: no apparent nausea or vomiting Anesthetic complications: no    Last Vitals:  Vitals:   10/27/17 1932 10/27/17 1955  BP: 112/76 113/75  Pulse: (!) 105 99  Resp: 13 18  Temp: 36.8 C 37.2 C  SpO2: 100% 100%    Last Pain:  Vitals:   10/27/17 1955  TempSrc: Oral  PainSc:                  Catalina Gravel

## 2017-10-27 NOTE — Op Note (Signed)
10/27/2017  6:48 PM  PATIENT:  Kristy Stone  41 y.o. female  PRE-OPERATIVE DIAGNOSIS:  Acute appendicitis  POST-OPERATIVE DIAGNOSIS:  Acute, non perforated appendicitis  PROCEDURE:  Procedure(s): APPENDECTOMY LAPAROSCOPIC (N/A)  SURGEON:  Surgeon(s) and Role:    Ralene Ok, MD - Primary   ANESTHESIA:   local and general  EBL:  minimal   BLOOD ADMINISTERED:none  DRAINS: none   LOCAL MEDICATIONS USED:  BUPIVICAINE   SPECIMEN:  Source of Specimen:  appendix  DISPOSITION OF SPECIMEN:  PATHOLOGY  COUNTS:  YES  TOURNIQUET:  * No tourniquets in log *  DICTATION: .Dragon Dictation  Complications: none  Counts: reported as correct x 2  Findings:  The patient had a acutely inflamed non perforated appendix  Specimen: Appendix  Indications for procedure:  The patient is a 41 year old female with a history of periumbilical pain localized in the right lower quadrant patient had a CT scan which revealed signs consistent with acute appendicitis the patient back in for laparoscopic appendectomy.  Details of the procedure:The patient was taken back to the operating room. The patient was placed in supine position with bilateral SCDs in place.  The patient was prepped and draped in the usual sterile fashion.  After appropriate anitbiotics were confirmed, a time-out was confirmed and all facts were verified.    A pneumoperitoneum of 14 mmHg was obtained via a Veress needle technique in the left lower quadrant quadrant.  A 5 mm trocar and 5 mm camera then placed intra-abdominally there is no injury to any intra-abdominal organs a 10 mm infraumbilical port was placed and direct visualization as was a 5 mm port in the suprapubic area.   The appendix was identified and seen to be non-perforated.  The appendix was cleaned down to the appendiceal base. The mesoappendix was then incised and the appendiceal artery was cauterized.  The the appendiceal base was clean.  At this time an  Endoloop was placed proximallyx2 and one distally and the appendix was transected between these 2. A retrieval bag was then placed into the abdomen and the specimen placed in the bag. The appendiceal stump was cauterized. We evacuate the fluid from the pelvis until the effluent was clear.  The appendix and retrieval  bag was then retrieved via the supraumbilical port. #1 Vicryl was used to reapproximate the fascia at the umbilical port site x1. The skin was reapproximated all port sites 3-0 Monocryl subcuticular fashion. The skin was dressed with steri-strips, guaze, and tape.  The patient had the foley removed. The patient was awakened from general anesthesia was taken to recovery room in stable condition.      PLAN OF CARE: Admit for overnight observation  PATIENT DISPOSITION:  PACU - hemodynamically stable.   Delay start of Pharmacological VTE agent (>24hrs) due to surgical blood loss or risk of bleeding: not applicable

## 2017-10-27 NOTE — ED Provider Notes (Signed)
[facility] EMERGENCY DEPARTMENT Provider Note   CSN: [id number] Arrival date & time: 10/27/17  1035     History   Chief Complaint Chief Complaint  Patient presents with  . Abdominal Pain    HPI Kristy Stone is a 41 y.o. female with no significant past medical history who presents the emergency department for lower abdominal pain.  Patient states that she awoke at 2 AM with severe abdominal pain that was located in the periumbilical and suprapubic area.  She says the pain is constant and describes it as a dull/crampy pain.  Over the last 1 hour the pain has migrated to the right lower quadrant.  The pain is worsened with movement.  She has had 3 episodes of nonbilious, nonbloody emesis since the event started.  She also has had 5 "normal" bowel movements since the onset of her pain. She normally has 1 BM daily.  There is associated nausea, anorexia and chills.  There is no associated watery stools, hematochezia or melena.  No preceding constipation.  She has tried Pepto-Bismol and Zofran for this without any relief.  At 9 AM she tried to drink water but was unable to hold this down.  Last solid p.o. intake last night.  The patient has history of 2 C-sections in the past.  No other abdominal surgeries.  The patient denies any fever, dysuria, urinary frequency, urinary urgency, flank pain, hematuria, vaginal discharge, vaginal bleeding, vaginal pain, ingestion of suspect foods or water, headache. LMP 2 year ago.   HPI  Past Medical History:  Diagnosis Date  . Headache(784.0)     There are no active problems to display for this patient.   Past Surgical History:  Procedure Laterality Date  . CESAREAN SECTION  x2  . DILATION AND CURETTAGE OF UTERUS  2007  . DILATION AND EVACUATION  11/25/2011   Procedure: DILATATION AND EVACUATION;  Surgeon: Allyn Kenner, DO;  Location: [facility];  Service: Gynecology;  Laterality: N/A;  dilatation and curettage    OB History    No data available       Home Medications    Prior to Admission medications   Medication Sig Start Date End Date Taking? Authorizing Provider  Multiple Vitamin (MULITIVITAMIN WITH MINERALS) TABS Take 1 tablet by mouth daily.      [provider]    Family History No family history on file.  Social History Social History   Tobacco Use  . Smoking status: Former Research scientist (life sciences)  . Smokeless tobacco: Never Used  Substance Use Topics  . Alcohol use: Yes  . Drug use: No     Allergies   Patient has no known allergies.   Review of Systems Review of Systems  All other systems reviewed and are negative.    Physical Exam Updated Vital Signs BP 126/80   Pulse 72   Temp 97.6 F (36.4 C) (Oral)   Resp 16   Ht 5\' 3"  (1.6 m)   Wt 64.4 kg (142 lb)   SpO2 100%   BMI 25.15 kg/m   Physical Exam  Constitutional: She appears well-developed and well-nourished.  HENT:  Head: Normocephalic and atraumatic.  Right Ear: External ear normal.  Left Ear: External ear normal.  Nose: Nose normal.  Mouth/Throat: Uvula is midline, oropharynx is clear and moist and mucous membranes are normal. No tonsillar exudate.  Eyes: Pupils are equal, round, and reactive to light. Right eye exhibits no discharge. Left eye exhibits no discharge. No scleral icterus.  Neck: Trachea normal. Neck supple. No spinous process tenderness present. No neck rigidity. Normal range of motion present.  Cardiovascular: Normal rate, regular rhythm and intact distal pulses.  No murmur heard. Pulses:      Radial pulses are 2+ on the right side, and 2+ on the left side.       Dorsalis pedis pulses are 2+ on the right side, and 2+ on the left side.       Posterior tibial pulses are 2+ on the right side, and 2+ on the left side.  No lower extremity swelling or edema. Calves symmetric in size bilaterally.  Pulmonary/Chest: Effort normal and breath sounds normal. She exhibits no tenderness.  Abdominal: Soft. Bowel sounds  are normal. She exhibits no distension. There is tenderness in the right lower quadrant, periumbilical area and suprapubic area. There is no rigidity, no rebound, no guarding and no CVA tenderness.  Mildly positive Psoas sign. Negative Rovsing's and Obturator signs.  Genitourinary:  Genitourinary Comments: Exam performed by Jillyn Ledger, exam chaperoned Pelvic exam: normal external genitalia without evidence of trauma. VULVA: normal appearing vulva with no masses, tenderness or lesion. VAGINA: normal appearing vagina with normal color and discharge, no lesions. CERVIX: normal appearing cervix without lesions, cervical motion tenderness absent, cervical os closed with out purulent discharge; vaginal discharge. Wet prep and DNA probe for chlamydia and GC obtained.   ADNEXA: normal adnexa in size. No masses. TTP of right adnexa.   UTERUS: uterus is normal size, shape, consistency and nontender.   Musculoskeletal: She exhibits no edema.  Lymphadenopathy:    She has no cervical adenopathy.  Neurological: She is alert.  Skin: Skin is warm and dry. No rash noted. She is not diaphoretic.  Psychiatric: She has a normal mood and affect.  Nursing note and vitals reviewed.   ED Treatments / Results  Labs (all labs ordered are listed, but only abnormal results are displayed) Labs Reviewed  COMPREHENSIVE METABOLIC PANEL - Abnormal; Notable for the following components:      Result Value   Sodium 134 (*)    Glucose, Bld 137 (*)    All other components within normal limits  CBC - Abnormal; Notable for the following components:   WBC 13.8 (*)    All other components within normal limits  LIPASE, BLOOD  URINALYSIS, ROUTINE W REFLEX MICROSCOPIC  I-STAT BETA HCG BLOOD, ED ([facility], [facility], [facility] ONLY)  I-STAT BETA HCG BLOOD, ED ([facility], [facility], [facility] ONLY)    EKG  EKG Interpretation None       Radiology No results found.  Procedures Procedures (including critical care time)  Medications Ordered in  ED Medications  sodium chloride 0.9 % bolus 1,000 mL (not administered)  ondansetron (ZOFRAN) injection 4 mg (not administered)  morphine 4 MG/ML injection 4 mg (not administered)  ondansetron (ZOFRAN-ODT) disintegrating tablet 4 mg (4 mg Oral Given 10/27/17 1111)     Initial Impression / Assessment and Plan / ED Course  I have reviewed the triage vital signs and the nursing notes.  Pertinent labs & imaging results that were available during my care of the patient were reviewed by me and considered in my medical decision making (see chart for details).     41 y.o. female presenting with acute onset of lower abdominal pain that woke her up out of her sleep at 2 AM this morning.  Pain originally began as periumbilical and has since migrated to the right lower quadrant.  There is associated chills, nausea,  emesis and anorexia.  Patient has no previous abdominal surgeries.  On presentation the patient is without fever, tachycardia, tachypnea, hypoxia or hypotension.  She is nonseptic appearing.  Abdominal exam with periumbilical, suprapubic and right lower quadrant tenderness to palpation.  She is tender over McBurney's point has a mildly positive psoas sign.  Abdomen soft and without peritoneal signs.  Pelvic exam also performed that showed right adnexal tenderness to palpation.  Patient made n.p.o., given IV fluid, nausea medication and pain medication.  Last P.O. intake 9 AM.  Labs taken in triage.  Will obtain CT scan of the abdomen to evaluate for intra-abdominal pathology.  Will obtain ultrasound to rule out ovarian torsion.  Patient's pain and nausea improved after IV fluids, Zofran and morphine.  repeat abdominal exam without peritoneal signs.  CBC with leukocytosis of 13.8.  There is mild hyponatremia of 134 and mild hyperglycemia of 137.  No anion gap acidosis.  Do not suspect DKA.  LFTs unremarkable.  Kidney function within normal limits.  Lipase within normal limits.  Pregnancy test  negative.  UA without signs of infection.  Pelvic ultrasound without evidence of ovarian torsion.  CT scan shows evidence of acute appendicitis with appendicolith.  No evidence of perforation or abscess.  Repeat abdominal exam without any rebound, guarding or rigidity.  She is still tender in the periumbilical, suprapubic and right lower quadrant.  Patient started on IV zosyn. Patient made aware of findings. Will consult general surgery.   Discussed case with Dr. Rosendo Gros who will admit the patient. Husband and patient made aware. They are in agreement with plan. Patient is resting comfortably and is hemodynamically stable. Appears safe for admission.    Final Clinical Impressions(s) / ED Diagnoses   Final diagnoses:  Acute appendicitis, unspecified acute appendicitis type    ED Discharge Orders    None       Jillyn Ledger, PA-C 10/27/17 1814    Gareth Morgan, MD 10/29/17 [phone]

## 2017-10-27 NOTE — Anesthesia Procedure Notes (Signed)
Procedure Name: Intubation Date/Time: 10/27/2017 6:22 PM Performed by: Oletta Lamas, CRNA Pre-anesthesia Checklist: Patient identified, Emergency Drugs available, Suction available and Patient being monitored Patient Re-evaluated:Patient Re-evaluated prior to induction Oxygen Delivery Method: Circle System Utilized Preoxygenation: Pre-oxygenation with 100% oxygen Induction Type: IV induction, Rapid sequence and Cricoid Pressure applied Laryngoscope Size: Mac and 3 Grade View: Grade I Tube type: Oral Tube size: 7.0 mm Number of attempts: 1 Airway Equipment and Method: Stylet Placement Confirmation: ETT inserted through vocal cords under direct vision,  positive ETCO2 and breath sounds checked- equal and bilateral Secured at: 22 cm Tube secured with: Tape Dental Injury: Teeth and Oropharynx as per pre-operative assessment

## 2017-10-27 NOTE — ED Notes (Signed)
Pt ambulated to restroom with steady gait Tolerated well 

## 2017-10-28 ENCOUNTER — Encounter (HOSPITAL_COMMUNITY): Payer: Self-pay | Admitting: General Surgery

## 2017-10-28 MED ORDER — OXYCODONE HCL 5 MG PO TABS: 5.0000 mg | ORAL_TABLET | ORAL | 0 refills | Status: DC | PRN

## 2017-10-28 NOTE — Discharge Instructions (Signed)
Please call our office Monday morning to schedule a follow up appointment in 2 weeks. Please arrive at least 30 min before your appointment to complete your check in paperwork.  If you are unable to arrive 30 min prior to your appointment time we may have to cancel or reschedule you.  LAPAROSCOPIC SURGERY: POST OP INSTRUCTIONS  1. DIET: Follow a light bland diet the first 24 hours after arrival home, such as soup, liquids, crackers, etc. Be sure to include lots of fluids daily. Avoid fast food or heavy meals as your are more likely to get nauseated. Eat a low fat the next few days after surgery.  2. Take your usually prescribed home medications unless otherwise directed. 3. PAIN CONTROL:  1. Pain is best controlled by a usual combination of three different methods TOGETHER:  1. Ice/Heat 2. Over the counter pain medication 3. Prescription pain medication 2. Most patients will experience some swelling and bruising around the incisions. Ice packs or heating pads (30-60 minutes up to 6 times a day) will help. Use ice for the first few days to help decrease swelling and bruising, then switch to heat to help relax tight/sore spots and speed recovery. Some people prefer to use ice alone, heat alone, alternating between ice & heat. Experiment to what works for you. Swelling and bruising can take several weeks to resolve.  3. It is helpful to take an over-the-counter pain medication regularly for the first few weeks. Choose one of the following that works best for you:  1. Naproxen (Aleve, etc) Two 220mg  tabs twice a day 2. Ibuprofen (Advil, etc) Three 200mg  tabs four times a day (every meal & bedtime) 3. Acetaminophen (Tylenol, etc) 500-650mg  four times a day (every meal & bedtime) 4. A prescription for pain medication (such as oxycodone, hydrocodone, etc) should be given to you upon discharge. Take your pain medication as prescribed.  1. If you are having problems/concerns with the prescription medicine  (does not control pain, nausea, vomiting, rash, itching, etc), please call us [phone] to see if we need to switch you to a different pain medicine that will work better for you and/or control your side effect better. 2. If you need a refill on your pain medication, please contact your pharmacy. They will contact our office to request authorization. Prescriptions will not be filled after 5 pm or on week-ends. 4. Avoid getting constipated. Between the surgery and the pain medications, it is common to experience some constipation. Increasing fluid intake and taking a fiber supplement (such as Metamucil, Citrucel, FiberCon, MiraLax, etc) 1-2 times a day regularly will usually help prevent this problem from occurring. A mild laxative (prune juice, Milk of Magnesia, MiraLax, etc) should be taken according to package directions if there are no bowel movements after 48 hours.  5. Watch out for diarrhea. If you have many loose bowel movements, simplify your diet to bland foods & liquids for a few days. Stop any stool softeners and decrease your fiber supplement. Switching to mild anti-diarrheal medications (Kayopectate, Pepto Bismol) can help. If this worsens or does not improve, please call us. 6. Remove your bandages 3 days after surgery. You may leave the incision open to air. You may replace a dressing/Band-Aid to cover the incision for comfort if you wish. You may shower over the incisions at this time. Do not bathe or submerge your incisions in water for 2 weeks 7. ACTIVITIES as tolerated:  1. You may resume regular (light) daily activities beginning the  next day--such as daily self-care, walking, climbing stairs--gradually increasing activities as tolerated. If you can walk 30 minutes without difficulty, it is safe to try more intense activity such as jogging, treadmill, bicycling, low-impact aerobics, swimming, etc. 2. Save the most intensive and strenuous activity for last such as sit-ups, heavy  lifting, contact sports, etc Refrain from any heavy lifting or straining until you are off narcotics for pain control.  3. DO NOT PUSH THROUGH PAIN. Let pain be your guide: If it hurts to do something, don't do it. Pain is your body warning you to avoid that activity for another week until the pain goes down. 4. You may drive when you are no longer taking prescription pain medication, you can comfortably wear a seatbelt, and you can safely maneuver your car and apply brakes. 5. You may have sexual intercourse when it is comfortable.  8. FOLLOW UP in our office  1. Please call [facility] at [phone] to set up an appointment to see your surgeon in the office for a follow-up appointment approximately 2-3 weeks after your surgery. 2. Make sure that you call for this appointment the day you arrive home to insure a convenient appointment time.      10. IF YOU HAVE DISABILITY OR FAMILY LEAVE FORMS, BRING THEM TO THE               OFFICE FOR PROCESSING.   WHEN TO CALL us [phone]:  1. Poor pain control 2. Reactions / problems with new medications (rash/itching, nausea, etc)  3. Fever over 101.5 F (38.5 C) 4. Inability to urinate 5. Nausea and/or vomiting 6. Worsening swelling or bruising 7. Continued bleeding from incision. 8. Increased pain, redness, or drainage from the incision  The clinic staff is available to answer your questions during regular business hours (8:30am-5pm). Please dont hesitate to call and ask to speak to one of our nurses for clinical concerns.  If you have a medical emergency, go to the nearest emergency room or call 911.  A surgeon from [facility] is always on call at the [facility], [place], [place], [place], [place] [address] ?  MAIN: [phone] ? TOLL FREE: [phone] ?  FAX [phone]  [email or web address]

## 2017-10-28 NOTE — Discharge Summary (Signed)
[facility]/Trauma Discharge Summary   Patient ID: Kristy Stone MRN: [id number] DOB/AGE: 42/42/1977 42 y.o.  Admit date: 10/27/2017 Discharge date: 10/28/2017  Admitting Diagnosis: appendicitis  Discharge Diagnosis Patient Active Problem List   Diagnosis Date Noted  . S/P appendectomy 10/27/2017    Consultants none  Imaging: US Transvaginal Non-ob  Result Date: 10/27/2017 CLINICAL DATA:  Right lower quadrant pain EXAM: TRANSABDOMINAL AND TRANSVAGINAL ULTRASOUND OF PELVIS DOPPLER ULTRASOUND OF OVARIES TECHNIQUE: Both transabdominal and transvaginal ultrasound examinations of the pelvis were performed. Transabdominal technique was performed for global imaging of the pelvis including uterus, ovaries, adnexal regions, and pelvic cul-de-sac. It was necessary to proceed with endovaginal exam following the transabdominal exam to visualize the ovaries. Color and duplex Doppler ultrasound was utilized to evaluate blood flow to the ovaries. COMPARISON:  None. FINDINGS: Uterus Measurements: 6.3 x 2.9 x 4.3 cm. No fibroids or other mass visualized. Endometrium Thickness: 1 mm in thickness.  No focal abnormality visualized. Right ovary Measurements: 2.2 x 0.8 x 1.6 cm. Normal appearance. Prominent tubular structures in the right adnexa are dilated pelvic veins. Left ovary Measurements: 1.9 x 1.2 x 1.5 cm. Normal appearance/no adnexal mass. Pulsed Doppler evaluation of both ovaries demonstrates normal low-resistance arterial and venous waveforms. Other findings No abnormal free fluid. IMPRESSION: No evidence of ovarian torsion or ovarian abnormality. Dilated pelvic veins in the right adnexal region compatible with parapelvic varicosities. Electronically Signed   By: Rolm Baptise M.D.   On: 10/27/2017 16:15   US Pelvis Complete  Result Date: 10/27/2017 CLINICAL DATA:  Right lower quadrant pain EXAM: TRANSABDOMINAL AND TRANSVAGINAL ULTRASOUND OF PELVIS DOPPLER ULTRASOUND OF OVARIES  TECHNIQUE: Both transabdominal and transvaginal ultrasound examinations of the pelvis were performed. Transabdominal technique was performed for global imaging of the pelvis including uterus, ovaries, adnexal regions, and pelvic cul-de-sac. It was necessary to proceed with endovaginal exam following the transabdominal exam to visualize the ovaries. Color and duplex Doppler ultrasound was utilized to evaluate blood flow to the ovaries. COMPARISON:  None. FINDINGS: Uterus Measurements: 6.3 x 2.9 x 4.3 cm. No fibroids or other mass visualized. Endometrium Thickness: 1 mm in thickness.  No focal abnormality visualized. Right ovary Measurements: 2.2 x 0.8 x 1.6 cm. Normal appearance. Prominent tubular structures in the right adnexa are dilated pelvic veins. Left ovary Measurements: 1.9 x 1.2 x 1.5 cm. Normal appearance/no adnexal mass. Pulsed Doppler evaluation of both ovaries demonstrates normal low-resistance arterial and venous waveforms. Other findings No abnormal free fluid. IMPRESSION: No evidence of ovarian torsion or ovarian abnormality. Dilated pelvic veins in the right adnexal region compatible with parapelvic varicosities. Electronically Signed   By: Rolm Baptise M.D.   On: 10/27/2017 16:15   Ct Abdomen Pelvis W Contrast  Result Date: 10/27/2017 CLINICAL DATA:  Suprapubic and right lower quadrant abdominal pain since 2 a.m. this morning with vomiting. EXAM: CT ABDOMEN AND PELVIS WITH CONTRAST TECHNIQUE: Multidetector CT imaging of the abdomen and pelvis was performed using the standard protocol following bolus administration of intravenous contrast. CONTRAST:  183mL ISOVUE-300 IOPAMIDOL (ISOVUE-300) INJECTION 61% COMPARISON:  Pelvic sonogram from earlier today. FINDINGS: Lower chest: No significant pulmonary nodules or acute consolidative airspace disease. Hepatobiliary: Normal liver with no liver mass. Normal gallbladder with no radiopaque cholelithiasis. No biliary ductal dilatation. Pancreas: Normal,  with no mass or duct dilation. Spleen: Normal size. No mass. Adrenals/Urinary Tract: Normal adrenals. Normal kidneys with no hydronephrosis and no renal mass. Normal bladder. Stomach/Bowel: Grossly normal stomach. Normal caliber small bowel with no  small bowel wall thickening. Diffusely dilated appendix up to 11 mm diameter. Multiple calcified appendicoliths, including a 6 mm appendicolith at the cecal base and a 14 mm appendicolith in the distal appendix. Borderline mild appendiceal wall thickening and periappendiceal fat haziness. Findings are compatible with acute appendicitis. Appendix: Location: Right lower quadrant/right pelvis (abutting the right tube and right ovary) Diameter: 11 mm Appendicolith: Present Mucosal hyper-enhancement: Mild Extraluminal gas: None Periappendiceal collection: None Normal large bowel with no diverticulosis, large bowel wall thickening or pericolonic fat stranding. Vascular/Lymphatic: Normal caliber abdominal aorta. Patent portal, splenic, hepatic and renal veins. No pathologically enlarged lymph nodes in the abdomen or pelvis. Reproductive: Grossly normal uterus.  No adnexal mass. Other: No pneumoperitoneum, ascites or focal fluid collection. Musculoskeletal: No aggressive appearing focal osseous lesions. IMPRESSION: Acute appendicitis with appendicoliths.  No perforation or abscess. These results were called by telephone at the time of interpretation on 10/27/2017 at 4:41 pm to Dr. Billy Fischer, who verbally acknowledged these results. Electronically Signed   By: Ilona Sorrel M.D.   On: 10/27/2017 16:45   [place] Art/ven Flow Abd Pelv Doppler  Result Date: 10/27/2017 CLINICAL DATA:  Right lower quadrant pain EXAM: TRANSABDOMINAL AND TRANSVAGINAL ULTRASOUND OF PELVIS DOPPLER ULTRASOUND OF OVARIES TECHNIQUE: Both transabdominal and transvaginal ultrasound examinations of the pelvis were performed. Transabdominal technique was performed for global imaging of the pelvis including  uterus, ovaries, adnexal regions, and pelvic cul-de-sac. It was necessary to proceed with endovaginal exam following the transabdominal exam to visualize the ovaries. Color and duplex Doppler ultrasound was utilized to evaluate blood flow to the ovaries. COMPARISON:  None. FINDINGS: Uterus Measurements: 6.3 x 2.9 x 4.3 cm. No fibroids or other mass visualized. Endometrium Thickness: 1 mm in thickness.  No focal abnormality visualized. Right ovary Measurements: 2.2 x 0.8 x 1.6 cm. Normal appearance. Prominent tubular structures in the right adnexa are dilated pelvic veins. Left ovary Measurements: 1.9 x 1.2 x 1.5 cm. Normal appearance/no adnexal mass. Pulsed Doppler evaluation of both ovaries demonstrates normal low-resistance arterial and venous waveforms. Other findings No abnormal free fluid. IMPRESSION: No evidence of ovarian torsion or ovarian abnormality. Dilated pelvic veins in the right adnexal region compatible with parapelvic varicosities. Electronically Signed   By: Rolm Baptise M.D.   On: 10/27/2017 16:15    Procedures Dr. Rosendo Gros (10/27/17) - Laparoscopic Appendectomy  Hospital Course:  Pt is a 41 year old female who presented to [facility] with abdominal pain.  Workup showed appendicitis.  Patient was admitted and underwent procedure listed above.  Tolerated procedure well and was transferred to the floor.  Diet was advanced as tolerated.  On POD#1, the patient was voiding well, tolerating diet, ambulating well, pain well controlled, vital signs stable, incisions c/d/i and felt stable for discharge home.  Patient will follow up in our office in 2 weeks and knows to call with questions or concerns.   Patient was discharged in good condition.  The [place] Substance controlled database was reviewed prior to prescribing narcotic pain medication to this patient.  Physical Exam: General:  Alert, NAD, pleasant, cooperative Cardio: RRR, S1 & S2 normal, no murmur, rubs, gallops Resp: Effort  normal, lungs CTA bilaterally, no wheezes, rales, rhonchi Abd:  Soft, ND, normal bowel sounds, mild generalized TTP without guarding, incisions C/D/I Skin: warm and dry  Allergies as of 10/28/2017   No Known Allergies     Medication List    TAKE these medications   ibuprofen 200 MG tablet Commonly known as:  ADVIL,MOTRIN  Take 200-400 mg by mouth every 6 (six) hours as needed for headache or mild pain.   ONE-A-DAY WOMENS FORMULA Tabs Take 1 tablet by mouth daily.   oxyCODONE 5 MG immediate release tablet Commonly known as:  Oxy IR/ROXICODONE Take 1 tablet (5 mg total) by mouth every 4 (four) hours as needed for moderate pain.   VITAMIN C PO Take 1 tablet by mouth daily.        Follow-up [facility], [place]. Schedule an appointment as soon as possible for a visit in 2 week(s).   Specialty:  General Surgery Why:  call Monday to schedule a follow up appointment Contact information: [address] [place] [phone]          Signed: [facility] 10/28/2017, 9:20 AM Pager: [phone] Consults: [phone] Mon-Fri 7:00 am-4:30 pm Sat-Sun 7:00 am-11:30 am

## 2017-10-28 NOTE — Progress Notes (Signed)
Pt for discharge going home her husband at the bedside , discontinue peripheral IV line, health teachings given, next appointment, wound site dry and intact, no complain of pain.

## 2017-10-30 LAB — GC/CHLAMYDIA PROBE AMP (~~LOC~~) NOT AT ARMC
Chlamydia: NEGATIVE
Neisseria Gonorrhea: NEGATIVE

## 2017-11-14 DIAGNOSIS — D2272 Melanocytic nevi of left lower limb, including hip: Secondary | ICD-10-CM | POA: Diagnosis not present

## 2017-11-14 DIAGNOSIS — D2262 Melanocytic nevi of left upper limb, including shoulder: Secondary | ICD-10-CM | POA: Diagnosis not present

## 2017-11-14 DIAGNOSIS — D485 Neoplasm of uncertain behavior of skin: Secondary | ICD-10-CM | POA: Diagnosis not present

## 2017-11-14 DIAGNOSIS — D2271 Melanocytic nevi of right lower limb, including hip: Secondary | ICD-10-CM | POA: Diagnosis not present

## 2017-11-14 DIAGNOSIS — D225 Melanocytic nevi of trunk: Secondary | ICD-10-CM | POA: Diagnosis not present

## 2017-11-14 DIAGNOSIS — L905 Scar conditions and fibrosis of skin: Secondary | ICD-10-CM | POA: Diagnosis not present

## 2018-01-13 DIAGNOSIS — J019 Acute sinusitis, unspecified: Secondary | ICD-10-CM | POA: Diagnosis not present

## 2018-01-18 DIAGNOSIS — J09X2 Influenza due to identified novel influenza A virus with other respiratory manifestations: Secondary | ICD-10-CM | POA: Diagnosis not present

## 2018-01-18 DIAGNOSIS — R509 Fever, unspecified: Secondary | ICD-10-CM | POA: Diagnosis not present

## 2018-07-25 DIAGNOSIS — Z0000 Encounter for general adult medical examination without abnormal findings: Secondary | ICD-10-CM | POA: Diagnosis not present

## 2018-07-31 DIAGNOSIS — Z1389 Encounter for screening for other disorder: Secondary | ICD-10-CM | POA: Diagnosis not present

## 2018-07-31 DIAGNOSIS — Z23 Encounter for immunization: Secondary | ICD-10-CM | POA: Diagnosis not present

## 2018-07-31 DIAGNOSIS — E28319 Asymptomatic premature menopause: Secondary | ICD-10-CM | POA: Diagnosis not present

## 2018-07-31 DIAGNOSIS — Z0000 Encounter for general adult medical examination without abnormal findings: Secondary | ICD-10-CM | POA: Diagnosis not present

## 2018-07-31 DIAGNOSIS — E7849 Other hyperlipidemia: Secondary | ICD-10-CM | POA: Diagnosis not present

## 2018-08-23 ENCOUNTER — Encounter: Payer: Self-pay | Admitting: Obstetrics and Gynecology

## 2018-08-23 ENCOUNTER — Ambulatory Visit: Payer: BLUE CROSS/BLUE SHIELD | Admitting: Obstetrics and Gynecology

## 2018-08-23 VITALS — BP 110/70 | HR 68 | Resp 16 | Ht 62.25 in | Wt 139.0 lb

## 2018-08-23 DIAGNOSIS — Z01419 Encounter for gynecological examination (general) (routine) without abnormal findings: Secondary | ICD-10-CM

## 2018-08-23 MED ORDER — SERTRALINE HCL 50 MG PO TABS: 50.0000 mg | ORAL_TABLET | Freq: Every day | ORAL | 2 refills | Status: DC

## 2018-08-23 NOTE — Progress Notes (Signed)
GYNECOLOGY VISIT  42 y.o. G5P0 Married Caucasian female here for annual exam as new patient.  Has premature ovarian failure and had a negative work up for adrenal insufficiency and chromosomal abnormalities through her previous GYN at [facility].  Feeling better since she was first dx with premature ovarian failure. Menopause at 39 and symptoms started 1 year prior to this.  Mood swings are better.  Sex is not as painful.  Increase hot flashes in the last month or so.   She tried Loestrin and did not like it.  Felt like she had fluid retention.  She was also concerned about breast cancer risk.  She has not been on birth control pills for over 2 years.   Took Zoloft in the past for PP depression and did well.   Slightly elevated cholesterol with PCP.  No medication.  2 children 11 and 14.  Moved from [place] 7 years ago.   PCP:   Marton Redwood MD  Patient's last menstrual period was 11/29/2015.           Sexually active: Yes.    The current method of family planning is post menopausal status.    Exercising: Yes.    Tennis and walking  Smoker:  no  Health Maintenance: Pap:  2017 per patient  History of abnormal Pap:  no MMG:  2017  Colonoscopy:  Na  BMD: 09/27/16    Result normal TDaP:  2019 per patient  Gardasil:   no HIV: 2014 per patient  Hep C:  NA Screening Labs:   PCP.   reports that she has quit smoking. She has never used smokeless tobacco. She reports that she drinks alcohol. She reports that she does not use drugs.  Past Medical History:  Diagnosis Date  . JOACZYSA(630.1)     Past Surgical History:  Procedure Laterality Date  . APPENDECTOMY  09/2017  . CESAREAN SECTION  2005 2008  . DILATION AND CURETTAGE OF UTERUS  2007  . DILATION AND EVACUATION  11/25/2011   Procedure: DILATATION AND EVACUATION;  Surgeon: Allyn Kenner, DO;  Location: [facility];  Service: Gynecology;  Laterality: N/A;  dilatation and curettage  . LAPAROSCOPIC  APPENDECTOMY N/A 10/27/2017   Procedure: APPENDECTOMY LAPAROSCOPIC;  Surgeon: Ralene Ok, MD;  Location: [facility];  Service: General;  Laterality: N/A;    Current Outpatient Medications  Medication Sig Dispense Refill  . Ascorbic Acid (VITAMIN C PO) Take 1 tablet by mouth daily.    . Multiple Vitamins-Calcium (ONE-A-DAY WOMENS FORMULA) TABS Take 1 tablet by mouth daily.    Marland Kitchen ibuprofen (ADVIL,MOTRIN) 200 MG tablet Take 200-400 mg by mouth every 6 (six) hours as needed for headache or mild pain.     No current facility-administered medications for this visit.     Family History  Problem Relation Age of Onset  . Cancer Maternal Grandmother     Review of Systems  All other systems reviewed and are negative.   Exam:   BP 110/70   Pulse 68   Resp 16   Ht 5' 2.25" (1.581 m)   Wt 139 lb (63 kg)   LMP 11/29/2015   BMI 25.22 kg/m     General appearance: alert, cooperative and appears stated age Head: Normocephalic, without obvious abnormality, atraumatic Neck: no adenopathy, supple, symmetrical, trachea midline and thyroid normal to inspection and palpation Lungs: clear to auscultation bilaterally Breasts: normal appearance, no masses or tenderness, No nipple retraction or dimpling, No nipple discharge or bleeding, No  axillary or supraclavicular adenopathy Heart: regular rate and rhythm Abdomen: soft, non-tender; no masses, no organomegaly Extremities: extremities normal, atraumatic, no cyanosis or edema Skin: Skin color, texture, turgor normal. No rashes or lesions Lymph nodes: Cervical, supraclavicular, and axillary nodes normal. No abnormal inguinal nodes palpated Neurologic: Grossly normal  Pelvic: External genitalia:  no lesions              Urethra:  normal appearing urethra with no masses, tenderness or lesions              Bartholins and Skenes: normal                 Vagina: normal appearing vagina with normal color and discharge, no lesions              Cervix: no  lesions              Pap taken: No. Bimanual Exam:  Uterus:  normal size, contour, position, consistency, mobility, non-tender              Adnexa: no mass, fullness, tenderness              Rectal exam: Yes.  .  Confirms.              Anus:  normal sphincter tone, no lesions  Chaperone was present for exam.  Assessment:   Well woman visit with normal exam. Premature ovarian failure.  Menopausal symptoms.  Off hormonal therapy.   Plan: Mammogram screening.  She will schedule.  Contact information for radiology centers in [place] to patient.  Recommended self breast awareness. Pap and HR HPV as above. Guidelines for Calcium, Vitamin D, regular exercise program including cardiovascular and weight bearing exercise. We reviewed risks and benefits of HRT, and she declines this.  Plan for Estring after mammogram is back.  Zoloft 50 mg daily.  I discussed effect of Zoloft and NSAIDs causing GI bleeding.  Labs with PCP.  FU in 2 months and for annual exam in one year.   After visit summary provided.

## 2018-08-23 NOTE — Patient Instructions (Signed)

## 2018-08-24 ENCOUNTER — Other Ambulatory Visit: Payer: Self-pay | Admitting: Obstetrics and Gynecology

## 2018-08-24 DIAGNOSIS — Z1231 Encounter for screening mammogram for malignant neoplasm of breast: Secondary | ICD-10-CM

## 2018-09-27 ENCOUNTER — Ambulatory Visit
Admission: RE | Admit: 2018-09-27 | Discharge: 2018-09-27 | Disposition: A | Discharge status: Home / Self Care | Payer: BLUE CROSS/BLUE SHIELD | Source: Ambulatory Visit | Attending: Obstetrics and Gynecology | Admitting: Obstetrics and Gynecology

## 2018-09-27 DIAGNOSIS — Z1231 Encounter for screening mammogram for malignant neoplasm of breast: Secondary | ICD-10-CM | POA: Diagnosis not present

## 2018-10-17 IMAGING — US US ART/VEN ABD/PELV/SCROTUM DOPPLER LTD
1 series · 13 of 25 positions shown · non-contrast
Comparison: None.

CLINICAL DATA: Right lower quadrant pain

EXAM:
TRANSABDOMINAL AND TRANSVAGINAL ULTRASOUND OF PELVIS
DOPPLER ULTRASOUND OF OVARIES
TECHNIQUE: Both transabdominal and transvaginal ultrasound examinations of the
pelvis were performed. Transabdominal technique was performed for
global imaging of the pelvis including uterus, ovaries, adnexal
regions, and pelvic cul-de-sac.
It was necessary to proceed with endovaginal exam following the
transabdominal exam to visualize the ovaries. Color and duplex
Doppler ultrasound was utilized to evaluate blood flow to the
ovaries.

[Series 1: us art/ven abd/pelv/scrotum doppler ltd · 0.21mm/px · 13 of 77 slices shown]
[im 1/77]
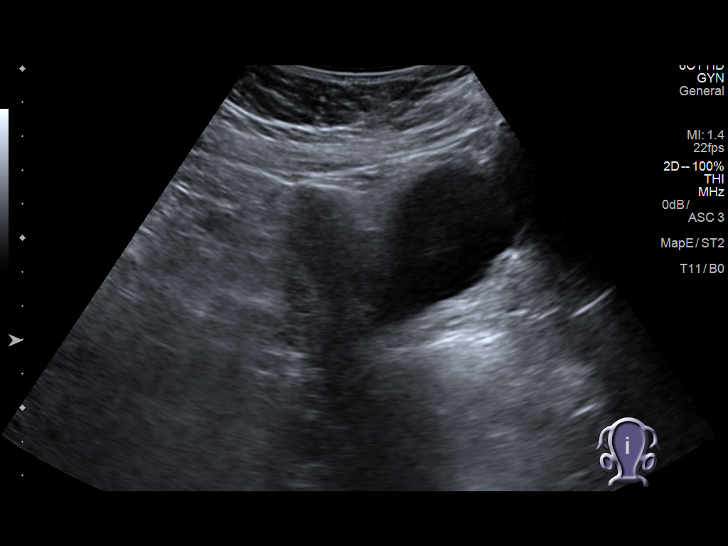
[im 7/77]
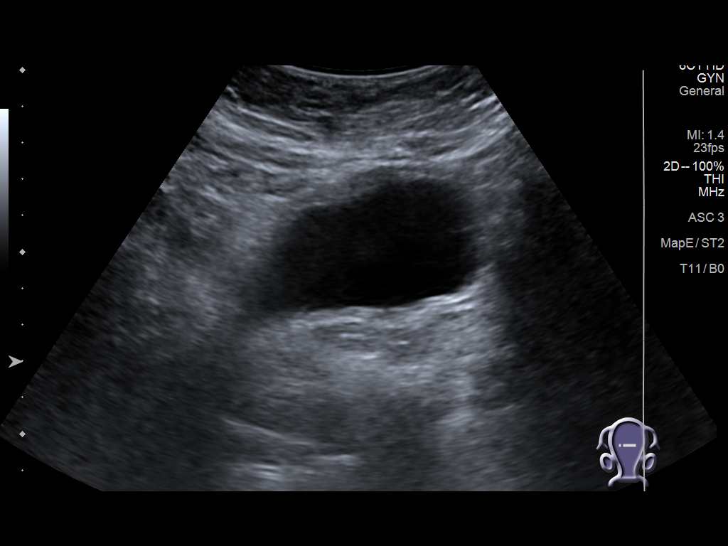
[im 13/77]
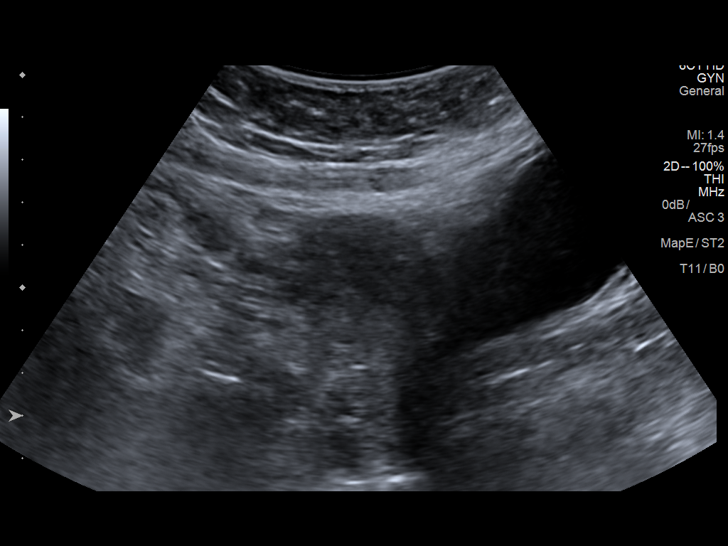
[im 20/77]
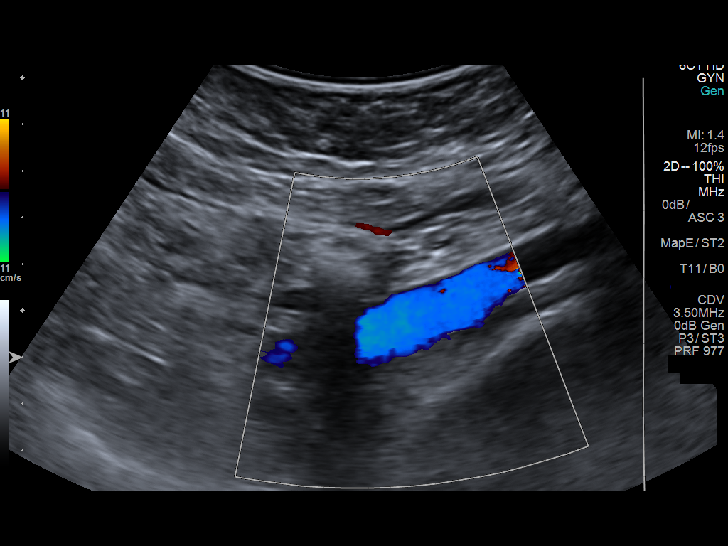
[im 26/77]
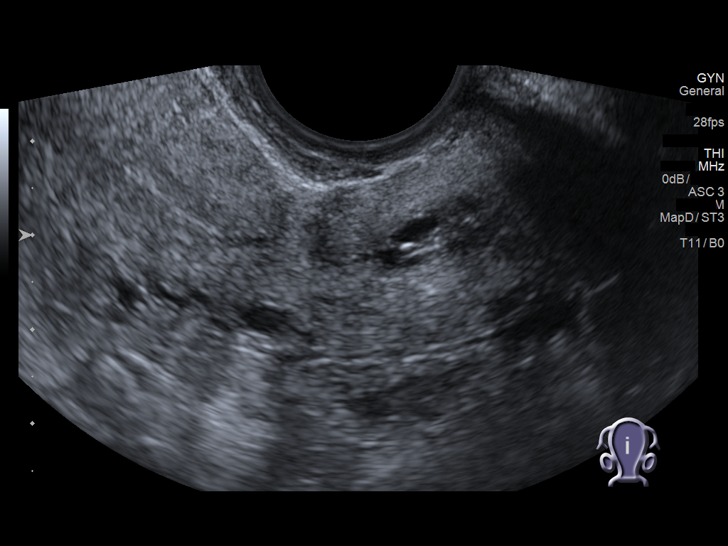
[im 32/77]
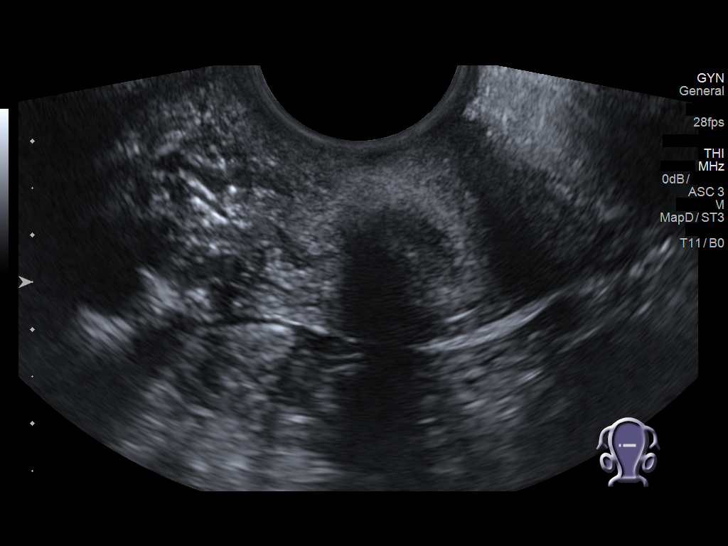
[im 39/77]
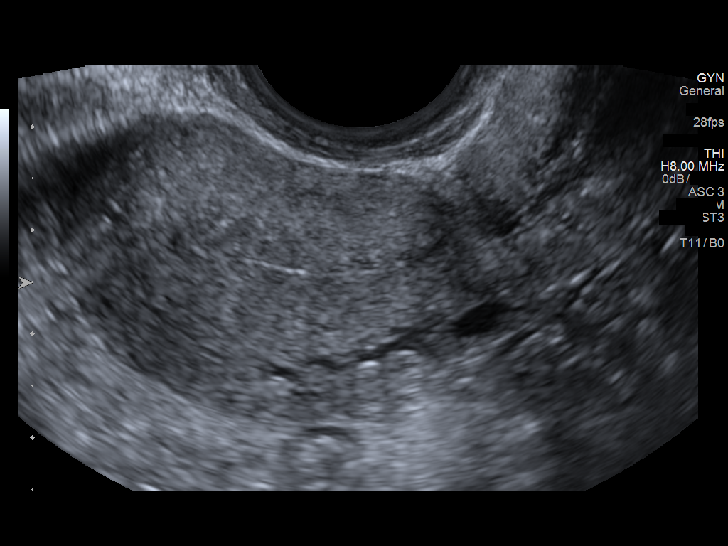
[im 45/77]
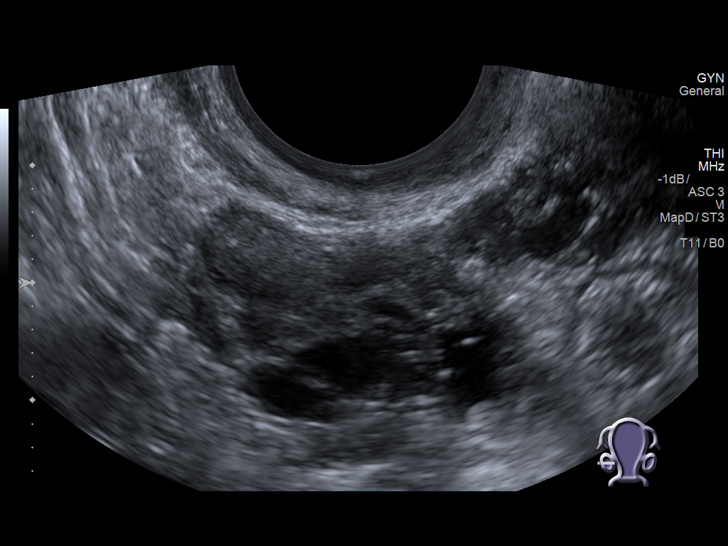
[im 51/77]
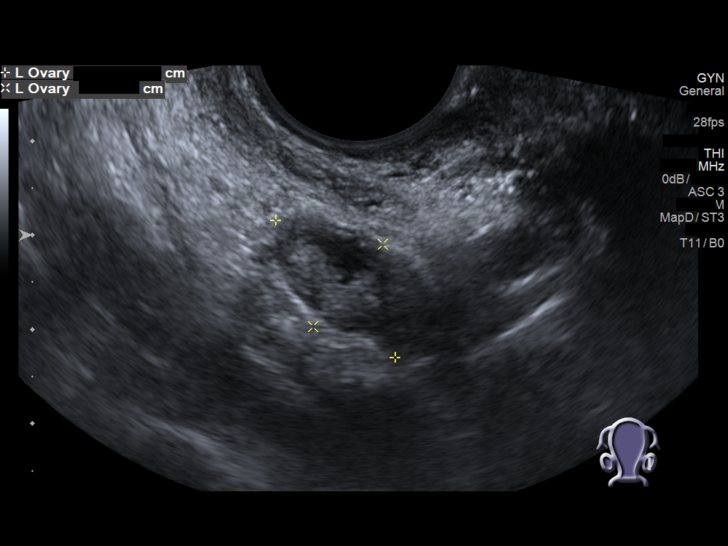
[im 58/77]
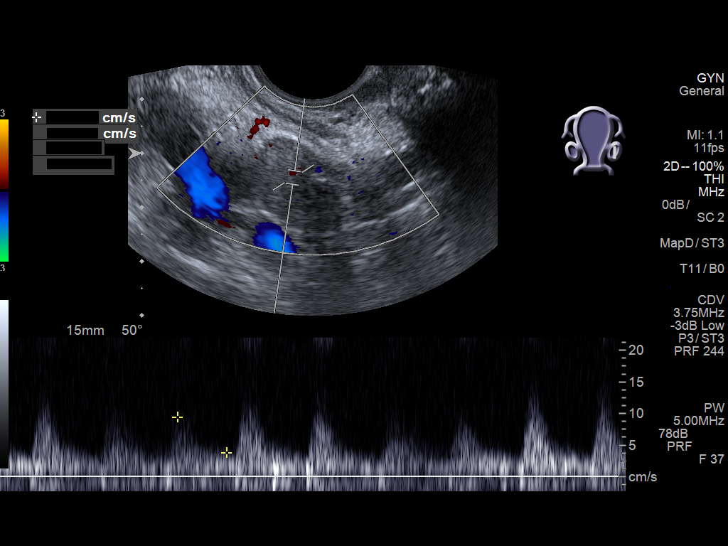
[im 64/77]
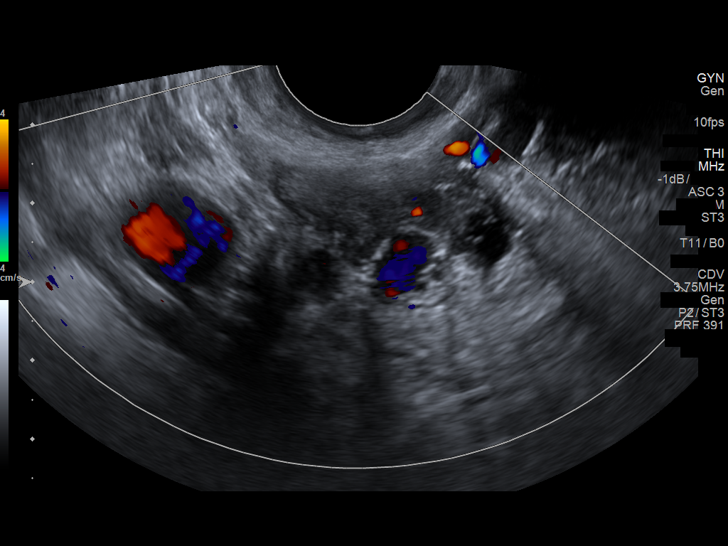
[im 70/77]
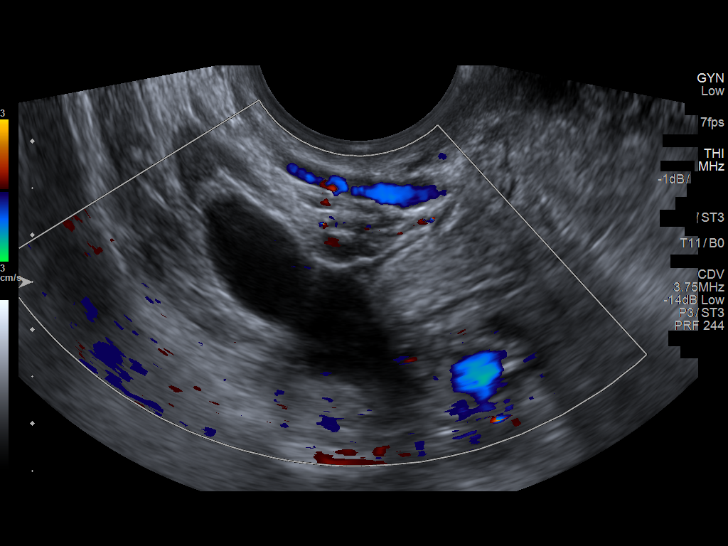
[im 77/77]
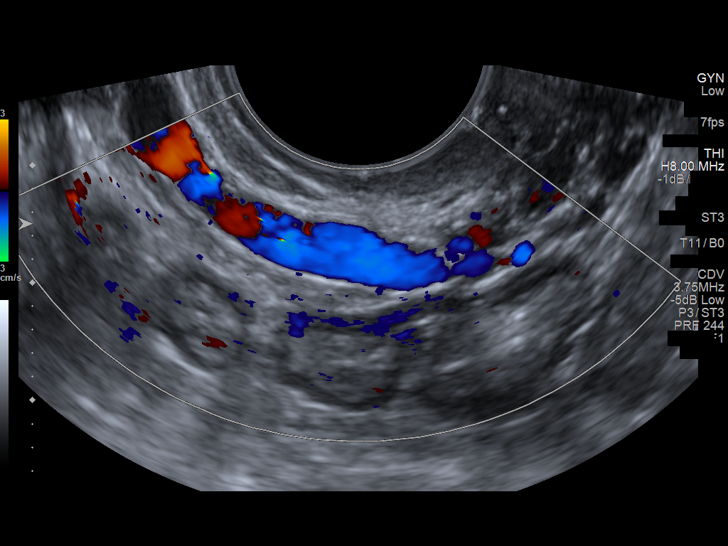

[13 of 25 positions shown; findings below may reference images not displayed]

FINDINGS: Uterus

Measurements: 6.3 x 2.9 x 4.3 cm. No fibroids or other mass
visualized.

Endometrium

Thickness: 1 mm in thickness.  No focal abnormality visualized.

Right ovary

Measurements: 2.2 x 0.8 x 1.6 cm. Normal appearance. Prominent
tubular structures in the right adnexa are dilated pelvic veins.

Left ovary

Measurements: 1.9 x 1.2 x 1.5 cm. Normal appearance/no adnexal mass.

Pulsed Doppler evaluation of both ovaries demonstrates normal
low-resistance arterial and venous waveforms.

Other findings

No abnormal free fluid.
IMPRESSION: No evidence of ovarian torsion or ovarian abnormality.

Dilated pelvic veins in the right adnexal region compatible with
parapelvic varicosities.

## 2018-10-19 ENCOUNTER — Ambulatory Visit: Payer: BLUE CROSS/BLUE SHIELD | Admitting: Obstetrics and Gynecology

## 2018-10-19 NOTE — Progress Notes (Signed)
GYNECOLOGY  VISIT   HPI: 42 y.o.   Married  Caucasian  female   G54P0 with Patient's last menstrual period was 11/29/2015.   here for follow-up after beginning Zoloft.   Feels more mood steadiness.  Not seeing the swings.  Helping a lot with hot flashes. Not having hot flashes.  Takes it at night as it made her sleepy.  Sleeping great per patient.  Some decrease in libido, otherwise not having any side effects.  Not feeling the lows unless forgets to take the medication.   Exercising more and now doing a message once per month.  Declines internal vaginal estrogen treatments.  Does not need it.   GYNECOLOGIC HISTORY: Patient's last menstrual period was 11/29/2015. Contraception:  Postmenopausal Menopausal hormone therapy:  none Last mammogram: 09-27-18 3D Neg/density C/BiRads1 Last pap smear:  2017 Neg        OB History    Gravida  4   Para      Term      Preterm      AB      Living  2     SAB      TAB      Ectopic      Multiple      Live Births                 Patient Active Problem List   Diagnosis Date Noted  . S/P appendectomy 10/27/2017    Past Medical History:  Diagnosis Date  . [id number](932.3)     Past Surgical History:  Procedure Laterality Date  . APPENDECTOMY  09/2017  . CESAREAN SECTION  2005 2008  . DILATION AND CURETTAGE OF UTERUS  2007  . DILATION AND EVACUATION  11/25/2011   Procedure: DILATATION AND EVACUATION;  Surgeon: Allyn Kenner, DO;  Location: [facility];  Service: Gynecology;  Laterality: N/A;  dilatation and curettage  . LAPAROSCOPIC APPENDECTOMY N/A 10/27/2017   Procedure: APPENDECTOMY LAPAROSCOPIC;  Surgeon: Ralene Ok, MD;  Location: [facility];  Service: General;  Laterality: N/A;    Current Outpatient Medications  Medication Sig Dispense Refill  . Ascorbic Acid (VITAMIN C PO) Take 1 tablet by mouth daily.    Marland Kitchen ibuprofen (ADVIL,MOTRIN) 200 MG tablet Take 200-400 mg by mouth every 6 (six) hours as needed for  headache or mild pain.    . Multiple Vitamins-Calcium (ONE-A-DAY WOMENS FORMULA) TABS Take 1 tablet by mouth daily.    . sertraline (ZOLOFT) 50 MG tablet Take 1 tablet (50 mg total) by mouth daily. 30 tablet 2   No current facility-administered medications for this visit.      ALLERGIES: Patient has no known allergies.  Family History  Problem Relation Age of Onset  . Cancer Maternal Grandmother     Social History   Socioeconomic History  . Marital status: Married    Spouse name: Not on file  . Number of children: Not on file  . Years of education: Not on file  . Highest education level: Not on file  Occupational History  . Not on file  Social Needs  . Financial resource strain: Not on file  . Food insecurity:    Worry: Not on file    Inability: Not on file  . Transportation needs:    Medical: Not on file    Non-medical: Not on file  Tobacco Use  . Smoking status: Former Research scientist (life sciences)  . Smokeless tobacco: Never Used  Substance and Sexual Activity  . Alcohol use: Yes  .  Drug use: No  . Sexual activity: Yes  Lifestyle  . Physical activity:    Days per week: Not on file    Minutes per session: Not on file  . Stress: Not on file  Relationships  . Social connections:    Talks on phone: Not on file    Gets together: Not on file    Attends religious service: Not on file    Active member of club or organization: Not on file    Attends meetings of clubs or organizations: Not on file    Relationship status: Not on file  . Intimate partner violence:    Fear of current or ex partner: Not on file    Emotionally abused: Not on file    Physically abused: Not on file    Forced sexual activity: Not on file  Other Topics Concern  . Not on file  Social History Narrative  . Not on file    Review of Systems  All other systems reviewed and are negative.   PHYSICAL EXAMINATION:    BP 120/62 (BP Location: Right Arm, Patient Position: Sitting, Cuff Size: Normal)   Pulse 70    Ht 5' 2.25" (1.581 m)   Wt 140 lb (63.5 kg)   LMP 11/29/2015   BMI 25.40 kg/m     General appearance: alert, cooperative and appears stated age   Chaperone was present for exam.  ASSESSMENT  Premature ovarian failure.  Menopausal symptoms controlled.  Off hormonal therapy.  On Zoloft and doing well.   PLAN  Continue Zoloft 50 mg daily.  She will call if she feels she needs to make a future dosage change.    An After Visit Summary was printed and given to the patient.  __15____ minutes face to face time of which over 50% was spent in counseling.

## 2018-10-22 ENCOUNTER — Ambulatory Visit: Payer: BLUE CROSS/BLUE SHIELD | Admitting: Obstetrics and Gynecology

## 2018-10-22 ENCOUNTER — Encounter: Payer: Self-pay | Admitting: Obstetrics and Gynecology

## 2018-10-22 ENCOUNTER — Other Ambulatory Visit: Payer: Self-pay

## 2018-10-22 VITALS — BP 120/62 | HR 70 | Ht 62.25 in | Wt 140.0 lb

## 2018-10-22 DIAGNOSIS — N951 Menopausal and female climacteric states: Secondary | ICD-10-CM

## 2018-10-22 MED ORDER — SERTRALINE HCL 50 MG PO TABS: 50.0000 mg | ORAL_TABLET | Freq: Every day | ORAL | 2 refills | Status: DC

## 2018-11-13 DIAGNOSIS — D2272 Melanocytic nevi of left lower limb, including hip: Secondary | ICD-10-CM | POA: Diagnosis not present

## 2018-11-13 DIAGNOSIS — D2262 Melanocytic nevi of left upper limb, including shoulder: Secondary | ICD-10-CM | POA: Diagnosis not present

## 2018-11-13 DIAGNOSIS — D2261 Melanocytic nevi of right upper limb, including shoulder: Secondary | ICD-10-CM | POA: Diagnosis not present

## 2018-11-13 DIAGNOSIS — D225 Melanocytic nevi of trunk: Secondary | ICD-10-CM | POA: Diagnosis not present

## 2019-01-06 ENCOUNTER — Other Ambulatory Visit: Payer: Self-pay | Admitting: Obstetrics and Gynecology

## 2019-08-21 ENCOUNTER — Other Ambulatory Visit: Payer: Self-pay | Admitting: Obstetrics and Gynecology

## 2019-08-21 DIAGNOSIS — Z1231 Encounter for screening mammogram for malignant neoplasm of breast: Secondary | ICD-10-CM

## 2019-08-26 ENCOUNTER — Other Ambulatory Visit: Payer: Self-pay

## 2019-08-27 NOTE — Progress Notes (Signed)
43 y.o. G29P0 Married Caucasian female here for annual exam.    Tried HRT only briefly, for one month, with her dx of premature ovarian failure.  Dx at age 57.   Stopped taking Zoloft 4 months ago, and feeling good.   Doing well during the pandemic.  PCP:   Marton Redwood, MD  Patient's last menstrual period was 11/29/2015.           Sexually active: Yes.    The current method of family planning is post menopausal status.    Exercising: Yes.    tennis and walking Smoker:  no  Health Maintenance: Pap: 2017 normal per patient, 01-24-12 Neg History of abnormal Pap:  no MMG: 09-27-18 3D/Neg/density C/BiRads1--pt has appt. Colonoscopy:  n/a BMD: 09-27-16  Result :Normal TDaP:  2017 per patient Gardasil:   no HIV: 2014 Neg per patient Hep C: n/a Screening Labs:   PCP.    reports that she has quit smoking. She has never used smokeless tobacco. She reports current alcohol use of about 3.0 - 5.0 standard drinks of alcohol per week. She reports that she does not use drugs.  Past Medical History:  Diagnosis Date  . Headache(784.0)   . Premature menopause     Past Surgical History:  Procedure Laterality Date  . APPENDECTOMY  09/2017  . CESAREAN SECTION  2005 2008  . DILATION AND CURETTAGE OF UTERUS  2007  . DILATION AND EVACUATION  11/25/2011   Procedure: DILATATION AND EVACUATION;  Surgeon: Allyn Kenner, DO;  Location: [facility];  Service: Gynecology;  Laterality: N/A;  dilatation and curettage  . LAPAROSCOPIC APPENDECTOMY N/A 10/27/2017   Procedure: APPENDECTOMY LAPAROSCOPIC;  Surgeon: Ralene Ok, MD;  Location: [facility];  Service: General;  Laterality: N/A;    Current Outpatient Medications  Medication Sig Dispense Refill  . Ascorbic Acid (VITAMIN C PO) Take 1 tablet by mouth daily.    Marland Kitchen ibuprofen (ADVIL,MOTRIN) 200 MG tablet Take 200-400 mg by mouth every 6 (six) hours as needed for headache or mild pain.    . Multiple Vitamins-Calcium (ONE-A-DAY WOMENS FORMULA) TABS Take  1 tablet by mouth daily.     No current facility-administered medications for this visit.     Family History  Problem Relation Age of Onset  . Cancer Maternal Grandmother     Review of Systems  All other systems reviewed and are negative.   Exam:   BP 120/70   Pulse 70   Temp 97.7 F (36.5 C) (Temporal)   Resp 16   Ht 5\' 2"  (1.575 m)   Wt 143 lb 9.6 oz (65.1 kg)   LMP 11/29/2015   BMI 26.26 kg/m     General appearance: alert, cooperative and appears stated age Head: normocephalic, without obvious abnormality, atraumatic Neck: no adenopathy, supple, symmetrical, trachea midline and thyroid normal to inspection and palpation Lungs: clear to auscultation bilaterally Breasts: normal appearance, no masses or tenderness, No nipple retraction or dimpling, No nipple discharge or bleeding, No axillary adenopathy Heart: regular rate and rhythm Abdomen: soft, non-tender; no masses, no organomegaly Extremities: extremities normal, atraumatic, no cyanosis or edema Skin: skin color, texture, turgor normal. No rashes or lesions Lymph nodes: cervical, supraclavicular, and axillary nodes normal. Neurologic: grossly normal  Pelvic: External genitalia:  no lesions              No abnormal inguinal nodes palpated.              Urethra:  normal appearing urethra with no  masses, tenderness or lesions              Bartholins and Skenes: normal                 Vagina: normal appearing vagina with normal color and discharge, atrophy noted               Cervix: no lesions              Pap taken: Yes.   Bimanual Exam:  Uterus:  normal size, contour, position, consistency, mobility, non-tender              Adnexa: no mass, fullness, tenderness              Rectal exam: Yes.  .  Confirms.              Anus:  normal sphincter tone, no lesions  Chaperone was present for exam.  Assessment:   Well woman visit with normal exam. Premature menopause. Negative work up at [facility].   Off  HRT.  Vaginal atrophy.   Plan: Mammogram screening discussed. Self breast awareness reviewed. Pap and HR HPV today as she does not have records of prior HPV testing. She is ok with paying for this.  Guidelines for Calcium, Vitamin D, regular exercise program including cardiovascular and weight bearing exercise. Flu vaccine recommended.  Start Premarin vaginal cream.  Instructed in use.  I discussed potential effect on breast CA. Follow up annually and prn.   After visit summary provided.

## 2019-08-28 ENCOUNTER — Other Ambulatory Visit (HOSPITAL_COMMUNITY)
Admission: RE | Admit: 2019-08-28 | Discharge: 2019-08-28 | Disposition: A | Discharge status: Home / Self Care | Payer: BC Managed Care – PPO | Source: Ambulatory Visit | Attending: Obstetrics and Gynecology | Admitting: Obstetrics and Gynecology

## 2019-08-28 ENCOUNTER — Ambulatory Visit (INDEPENDENT_AMBULATORY_CARE_PROVIDER_SITE_OTHER): Payer: BC Managed Care – PPO | Admitting: Obstetrics and Gynecology

## 2019-08-28 ENCOUNTER — Ambulatory Visit: Payer: BLUE CROSS/BLUE SHIELD | Admitting: Obstetrics and Gynecology

## 2019-08-28 ENCOUNTER — Other Ambulatory Visit: Payer: Self-pay

## 2019-08-28 ENCOUNTER — Encounter: Payer: Self-pay | Admitting: Obstetrics and Gynecology

## 2019-08-28 VITALS — BP 120/70 | HR 70 | Temp 97.7°F | Resp 16 | Ht 62.0 in | Wt 143.6 lb

## 2019-08-28 DIAGNOSIS — Z01419 Encounter for gynecological examination (general) (routine) without abnormal findings: Secondary | ICD-10-CM | POA: Diagnosis not present

## 2019-08-28 MED ORDER — PREMARIN 0.625 MG/GM VA CREA: TOPICAL_CREAM | VAGINAL | 3 refills | Status: DC

## 2019-08-28 NOTE — Patient Instructions (Signed)
EXERCISE AND DIET:  We recommended that you start or continue a regular exercise program for good health. Regular exercise means any activity that makes your heart beat faster and makes you sweat.  We recommend exercising at least 30 minutes per day at least 3 days a week, preferably 4 or 5.  We also recommend a diet low in fat and sugar.  Inactivity, poor dietary choices and obesity can cause diabetes, heart attack, stroke, and kidney damage, among others.   ° °ALCOHOL AND SMOKING:  Women should limit their alcohol intake to no more than 7 drinks/beers/glasses of wine (combined, not each!) per week. Moderation of alcohol intake to this level decreases your risk of breast cancer and liver damage. And of course, no recreational drugs are part of a healthy lifestyle.  And absolutely no smoking or even second hand smoke. Most people know smoking can cause heart and lung diseases, but did you know it also contributes to weakening of your bones? Aging of your skin?  Yellowing of your teeth and nails? ° °CALCIUM AND VITAMIN D:  Adequate intake of calcium and Vitamin D are recommended.  The recommendations for exact amounts of these supplements seem to change often, but generally speaking 600 mg of calcium (either carbonate or citrate) and 800 units of Vitamin D per day seems prudent. Certain women may benefit from higher intake of Vitamin D.  If you are among these women, your doctor will have told you during your visit.   ° °PAP SMEARS:  Pap smears, to check for cervical cancer or precancers,  have traditionally been done yearly, although recent scientific advances have shown that most women can have pap smears less often.  However, every woman still should have a physical exam from her gynecologist every year. It will include a breast check, inspection of the vulva and vagina to check for abnormal growths or skin changes, a visual exam of the cervix, and then an exam to evaluate the size and shape of the uterus and  ovaries.  And after 44 years of age, a rectal exam is indicated to check for rectal cancers. We will also provide age appropriate advice regarding health maintenance, like when you should have certain vaccines, screening for sexually transmitted diseases, bone density testing, colonoscopy, mammograms, etc.  ° °MAMMOGRAMS:  All women over 40 years old should have a yearly mammogram. Many facilities now offer a "3D" mammogram, which may cost around $50 extra out of pocket. If possible,  we recommend you accept the option to have the 3D mammogram performed.  It both reduces the number of women who will be called back for extra views which then turn out to be normal, and it is better than the routine mammogram at detecting truly abnormal areas.   ° °COLONOSCOPY:  Colonoscopy to screen for colon cancer is recommended for all women at age 50.  We know, you hate the idea of the prep.  We agree, BUT, having colon cancer and not knowing it is worse!!  Colon cancer so often starts as a polyp that can be seen and removed at colonscopy, which can quite literally save your life!  And if your first colonoscopy is normal and you have no family history of colon cancer, most women don't have to have it again for 10 years.  Once every ten years, you can do something that may end up saving your life, right?  We will be happy to help you get it scheduled when you are ready.    Be sure to check your insurance coverage so you understand how much it will cost.  It may be covered as a preventative service at no cost, but you should check your particular policy.    Atrophic Vaginitis  Atrophic vaginitis is a condition in which the tissues that line the vagina become dry and thin. This condition is most common in women who have stopped having regular menstrual periods (are in menopause). This usually starts when a woman is 90-50 years old. That is the time when a woman's estrogen levels begin to drop (decrease). Estrogen is a female  hormone. It helps to keep the tissues of the vagina moist. It stimulates the vagina to produce a clear fluid that lubricates the vagina for sexual intercourse. This fluid also protects the vagina from infection. Lack of estrogen can cause the lining of the vagina to get thinner and dryer. The vagina may also shrink in size. It may become less elastic. Atrophic vaginitis tends to get worse over time as a woman's estrogen level drops. What are the causes? This condition is caused by the normal drop in estrogen that happens around the time of menopause. What increases the risk? Certain conditions or situations may lower a woman's estrogen level, leading to a higher risk for atrophic vaginitis. You are more likely to develop this condition if:  You are taking medicines that block estrogen.  You have had your ovaries removed.  You are being treated for cancer with X-ray (radiation) or medicines (chemotherapy).  You have given birth or are breastfeeding.  You are older than age 53.  You smoke. What are the signs or symptoms? Symptoms of this condition include:  Pain, soreness, or bleeding during sexual intercourse (dyspareunia).  Vaginal burning, irritation, or itching.  Pain or bleeding when a speculum is used in a vaginal exam (pelvic exam).  Having burning pain when passing urine.  Vaginal discharge that is brown or yellow. In some cases, there are no symptoms. How is this diagnosed? This condition is diagnosed by taking a medical history and doing a physical exam. This will include a pelvic exam that checks the vaginal tissues. Though rare, you may also have other tests, including:  A urine test.  A test that checks the acid balance in your vagina (acid balance test). How is this treated? Treatment for this condition depends on how severe your symptoms are. Treatment may include:  Using an over-the-counter vaginal lubricant before sex.  Using a long-acting vaginal  moisturizer.  Using low-dose vaginal estrogen for moderate to severe symptoms that do not respond to other treatments. Options include creams, tablets, and inserts (vaginal rings). Before you use a vaginal estrogen, tell your health care provider if you have a history of: ? Breast cancer. ? Endometrial cancer. ? Blood clots. If you are not sexually active and your symptoms are very mild, you may not need treatment. Follow these instructions at home: Medicines  Take over-the-counter and prescription medicines only as told by your health care provider. Do not use herbal or alternative medicines unless your health care provider says that you can.  Use over-the-counter creams, lubricants, or moisturizers for dryness only as directed by your health care provider. General instructions  If your atrophic vaginitis is caused by menopause, discuss all of your menopause symptoms and treatment options with your health care provider.  Do not douche.  Do not use products that can make your vagina dry. These include: ? Scented feminine sprays. ? Scented tampons. ? Scented  soaps.  Vaginal intercourse can help to improve blood flow and elasticity of vaginal tissue. If it hurts to have sex, try using a lubricant or moisturizer just before having intercourse. Contact a health care provider if:  Your discharge looks different than normal.  Your vagina has an unusual smell.  You have new symptoms.  Your symptoms do not improve with treatment.  Your symptoms get worse. Summary  Atrophic vaginitis is a condition in which the tissues that line the vagina become dry and thin. It is most common in women who have stopped having regular menstrual periods (are in menopause).  Treatment options include using vaginal lubricants and low-dose vaginal estrogen.  Contact a health care provider if your vagina has an unusual smell, or if your symptoms get worse or do not improve after treatment. This  information is not intended to replace advice given to you by your health care provider. Make sure you discuss any questions you have with your health care provider. Document Released: 03/31/2015 Document Revised: 10/27/2017 Document Reviewed: 08/10/2017 Elsevier Patient Education  [place].  Conjugated Estrogens vaginal cream What is this medicine? CONJUGATED ESTROGENS (CON ju gate ed ESS troe jenz) are a mixture of female hormones. This cream can help relieve symptoms associated with menopause.like vaginal dryness and irritation. This medicine may be used for other purposes; ask your health care provider or pharmacist if you have questions. COMMON BRAND NAME(S): Premarin What should I tell my health care provider before I take this medicine? They need to know if you have any of these conditions:  abnormal vaginal bleeding  blood vessel disease or blood clots  breast, cervical, endometrial, or uterine cancer  dementia  diabetes  gallbladder disease  heart disease or recent heart attack  high blood pressure  high cholesterol  high level of calcium in the blood  hysterectomy  kidney disease  liver disease  migraine headaches  protein C deficiency  protein S deficiency  stroke  systemic lupus erythematosus (SLE)  tobacco smoker  an unusual or allergic reaction to estrogens other medicines, foods, dyes, or preservatives  pregnant or trying to get pregnant  breast-feeding How should I use this medicine? This medicine is for use in the vagina only. Do not take by mouth. Follow the directions on the prescription label. Use at bedtime unless otherwise directed by your doctor or health care professional. Use the special applicator supplied with the cream. Wash hands before and after use. Fill the applicator with the cream and remove from the tube. Lie on your back, part and bend your knees. Insert the applicator into the vagina and push the plunger to expel  the cream into the vagina. Wash the applicator with warm soapy water and rinse well. Use exactly as directed for the complete length of time prescribed. Do not stop using except on the advice of your doctor or health care professional. Talk to your pediatrician regarding the use of this medicine in children. Special care may be needed. A patient package insert for the product will be given with each prescription and refill. Read this sheet carefully each time. The sheet may change frequently. Overdosage: If you think you have taken too much of this medicine contact a poison control center or emergency room at once. NOTE: This medicine is only for you. Do not share this medicine with others. What if I miss a dose? If you miss a dose, use it as soon as you can. If it is almost time for  your next dose, use only that dose. Do not use double or extra doses. What may interact with this medicine? Do not take this medicine with any of the following medications:  aromatase inhibitors like aminoglutethimide, anastrozole, exemestane, letrozole, testolactone This medicine may also interact with the following medications:  barbiturates used for inducing sleep or treating seizures  carbamazepine  grapefruit juice  medicines for fungal infections like itraconazole and ketoconazole  raloxifene or tamoxifen  rifabutin  rifampin  rifapentine  ritonavir  some antibiotics used to treat infections  St. John's Wort  warfarin This list may not describe all possible interactions. Give your health care provider a list of all the medicines, herbs, non-prescription drugs, or dietary supplements you use. Also tell them if you smoke, drink alcohol, or use illegal drugs. Some items may interact with your medicine. What should I watch for while using this medicine? Visit your health care professional for regular checks on your progress. You will need a regular breast and pelvic exam. You should also discuss  the need for regular mammograms with your health care professional, and follow his or her guidelines. This medicine can make your body retain fluid, making your fingers, hands, or ankles swell. Your blood pressure can go up. Contact your doctor or health care professional if you feel you are retaining fluid. If you have any reason to think you are pregnant; stop taking this medicine at once and contact your doctor or health care professional. Tobacco smoking increases the risk of getting a blood clot or having a stroke, especially if you are more than 44 years old. You are strongly advised not to smoke. If you wear contact lenses and notice visual changes, or if the lenses begin to feel uncomfortable, consult your eye care specialist. If you are going to have elective surgery, you may need to stop taking this medicine beforehand. Consult your health care professional for advice prior to scheduling the surgery. What side effects may I notice from receiving this medicine? Side effects that you should report to your doctor or health care professional as soon as possible:  allergic reactions like skin rash, itching or hives, swelling of the face, lips, or tongue  breast tissue changes or discharge  changes in vision  chest pain  confusion, trouble speaking or understanding  dark urine  general ill feeling or flu-like symptoms  light-colored stools  nausea, vomiting  pain, swelling, warmth in the leg  right upper belly pain  severe headaches  shortness of breath  sudden numbness or weakness of the face, arm or leg  trouble walking, dizziness, loss of balance or coordination  unusual vaginal bleeding  yellowing of the eyes or skin Side effects that usually do not require medical attention (report to your doctor or health care professional if they continue or are bothersome):  hair loss  increased hunger or thirst  increased urination  symptoms of vaginal infection like  itching, irritation or unusual discharge  unusually weak or tired This list may not describe all possible side effects. Call your doctor for medical advice about side effects. You may report side effects to FDA at 1-800-FDA-1088. Where should I keep my medicine? Keep out of the reach of children. Store at room temperature between 15 and 30 degrees C (59 and 86 degrees F). Throw away any unused medicine after the expiration date. NOTE: This sheet is a summary. It may not cover all possible information. If you have questions about this medicine, talk to your doctor,  pharmacist, or health care provider.  2020 Elsevier/Gold Standard (2011-02-16 09:20:36)

## 2019-09-02 LAB — CYTOLOGY - PAP
Diagnosis: NEGATIVE
High risk HPV: NEGATIVE

## 2019-10-07 ENCOUNTER — Other Ambulatory Visit: Payer: Self-pay

## 2019-10-07 ENCOUNTER — Ambulatory Visit
Admission: RE | Admit: 2019-10-07 | Discharge: 2019-10-07 | Disposition: A | Discharge status: Home / Self Care | Payer: BLUE CROSS/BLUE SHIELD | Source: Ambulatory Visit | Attending: Obstetrics and Gynecology | Admitting: Obstetrics and Gynecology

## 2019-10-07 DIAGNOSIS — Z1231 Encounter for screening mammogram for malignant neoplasm of breast: Secondary | ICD-10-CM | POA: Diagnosis not present

## 2019-11-28 IMAGING — MG DIGITAL SCREENING BILATERAL MAMMOGRAM WITH TOMO AND CAD
3 series · 3 of 7 positions shown · non-contrast
Comparison: Previous exam(s).

CLINICAL DATA: Screening.

EXAM:
DIGITAL SCREENING BILATERAL MAMMOGRAM WITH TOMO AND CAD

[R CC synth-2D]
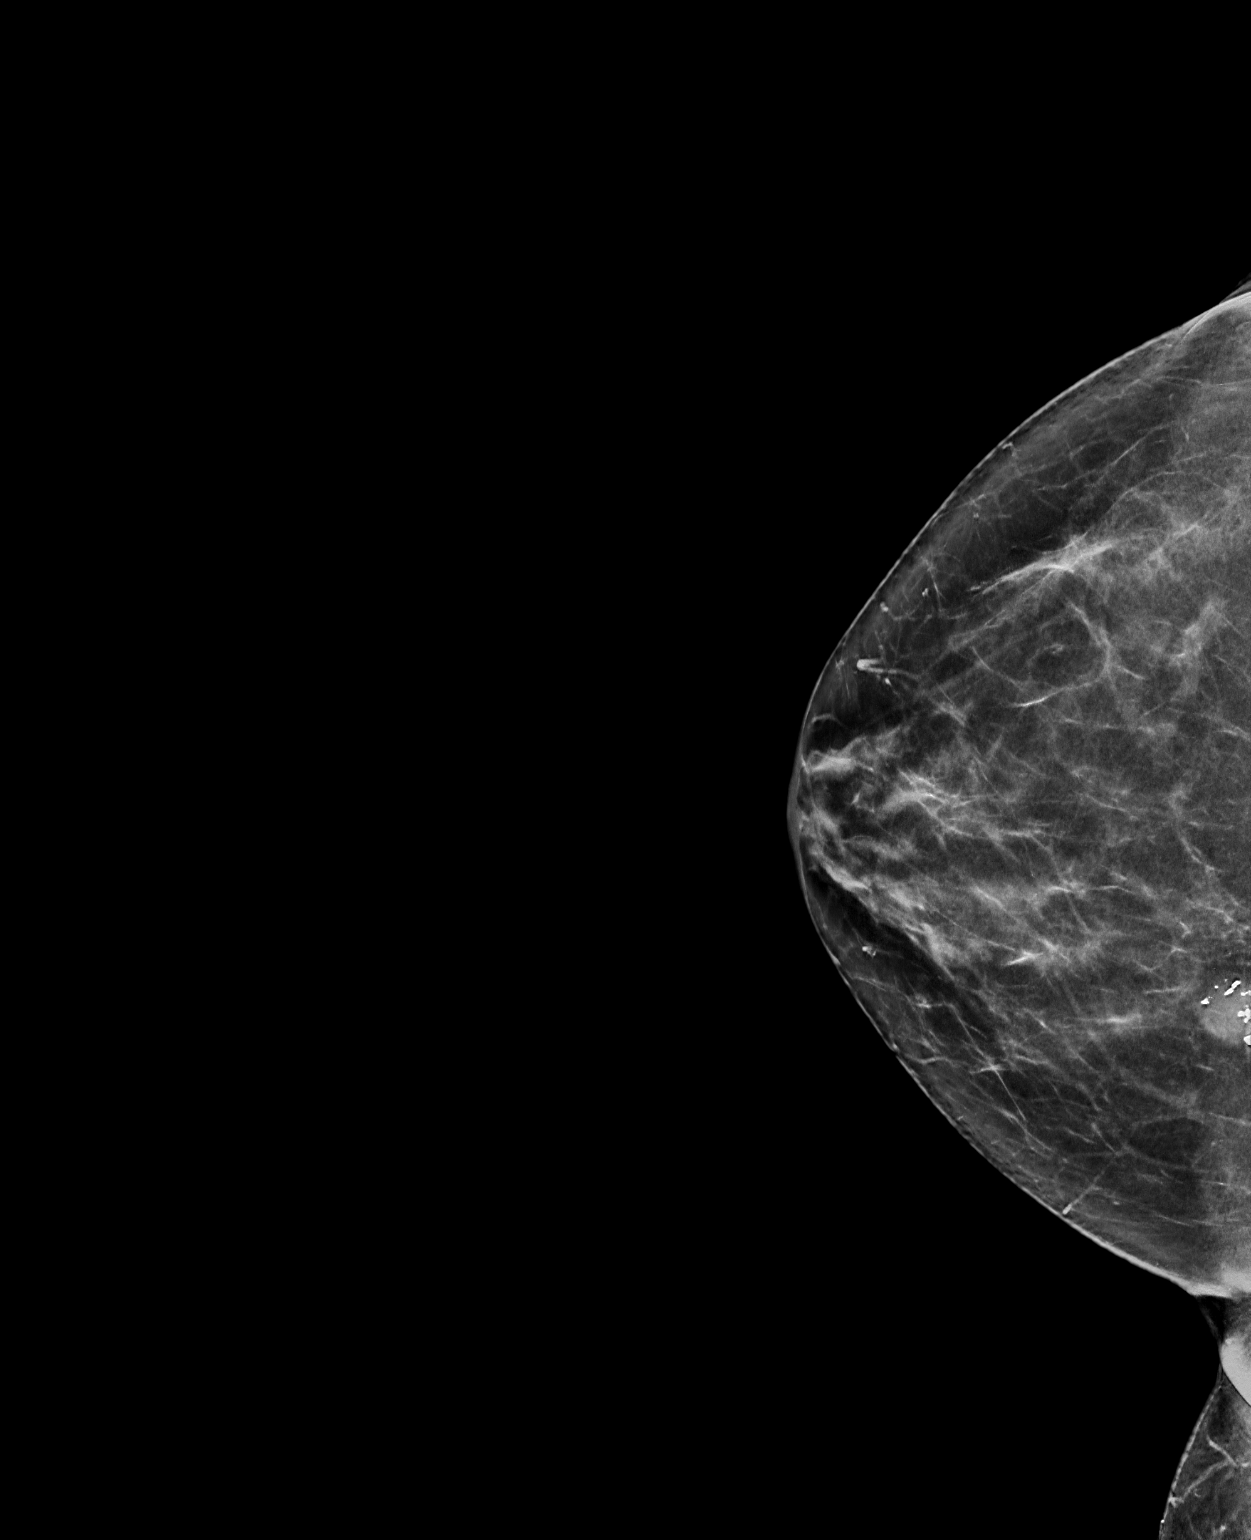

[R MLO synth-2D]
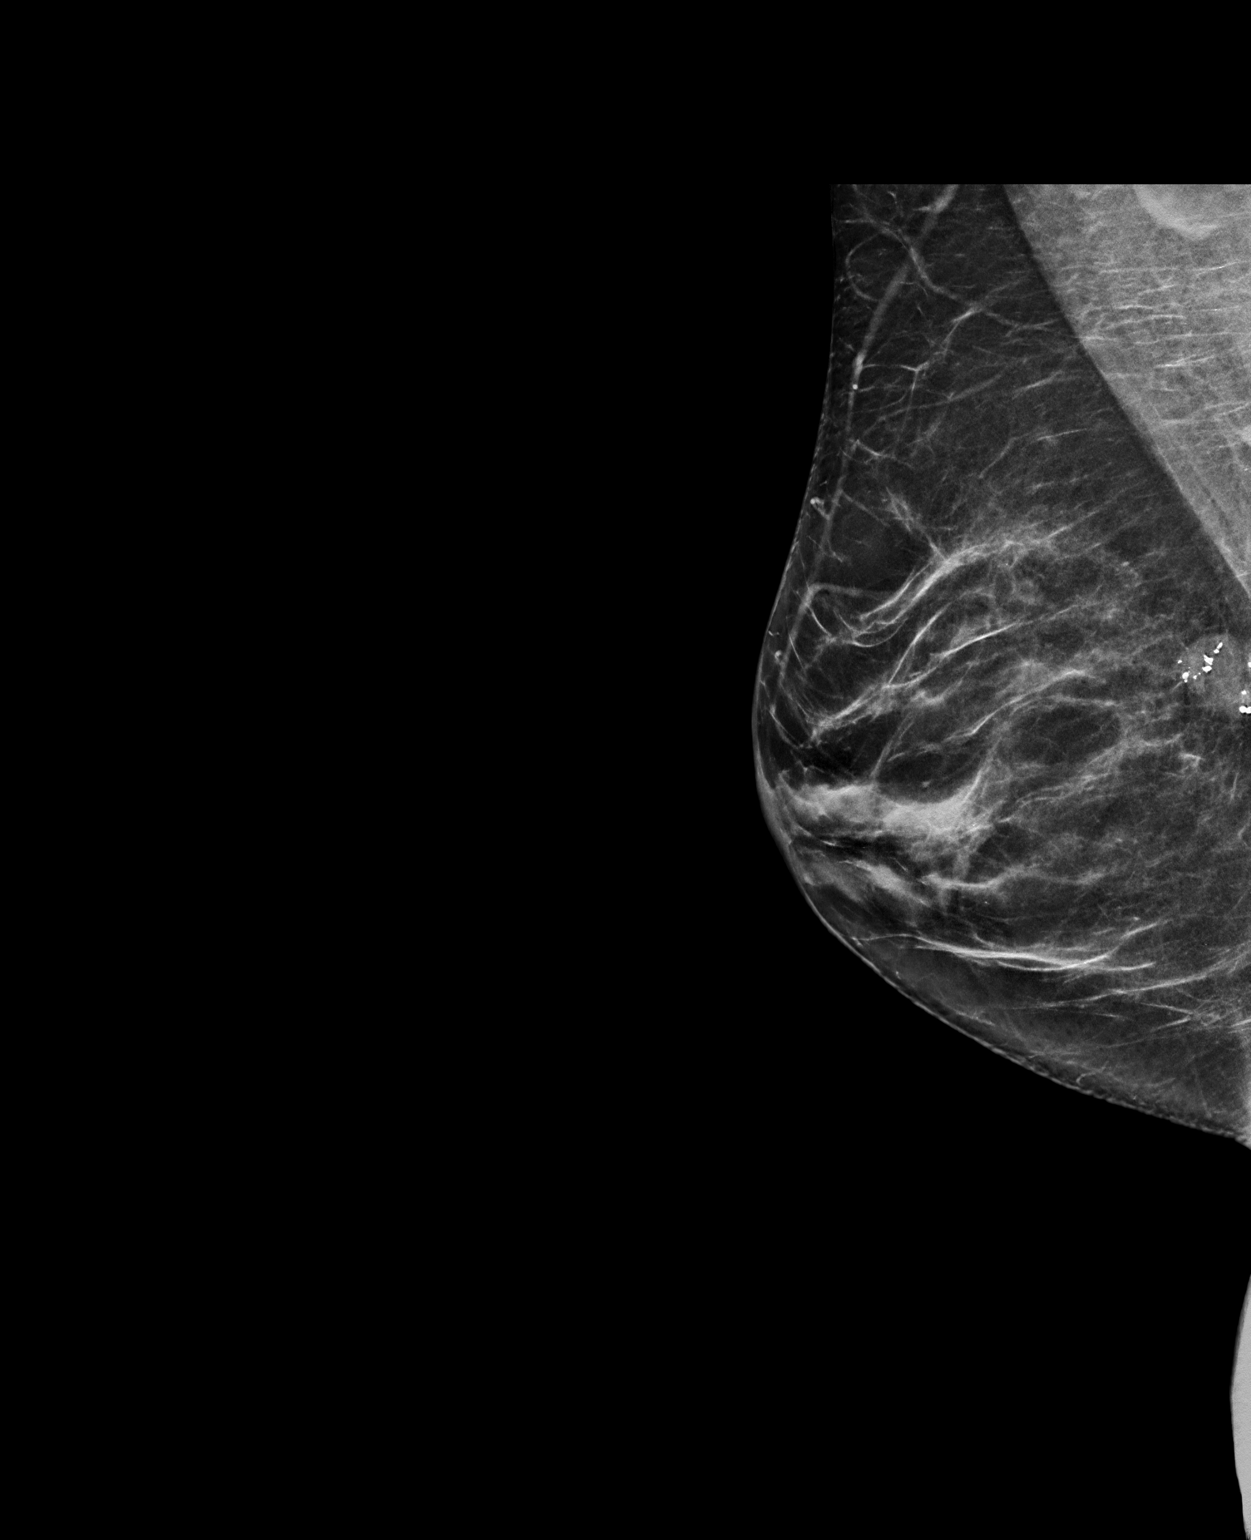

[R MLO tomo · tomo slice 35/69.0]
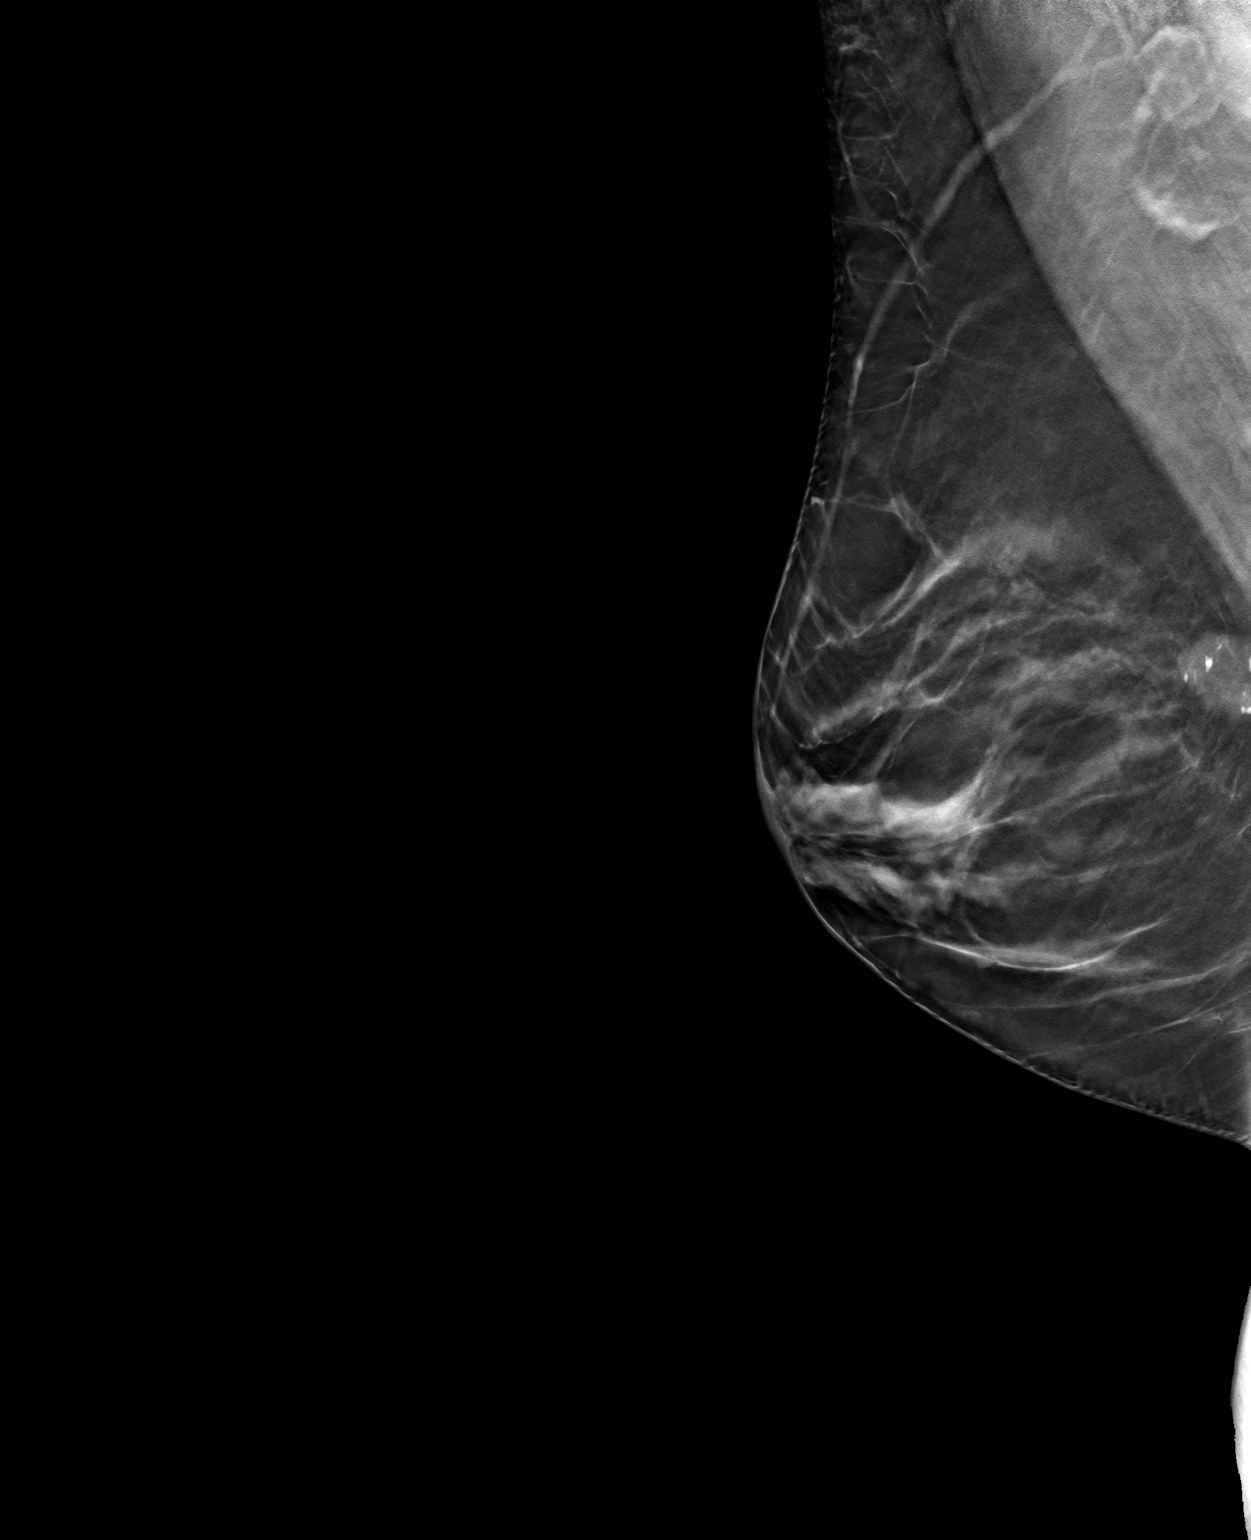

[3 of 7 positions shown; findings below may reference images not displayed]

ACR Breast Density Category c: The breast tissue is heterogeneously
dense, which may obscure small masses.
FINDINGS: There are no findings suspicious for malignancy. Images were
processed with CAD.
IMPRESSION: No mammographic evidence of malignancy. A result letter of this
screening mammogram will be mailed directly to the patient.

RECOMMENDATION:
Screening mammogram in one year. (Code:[id number])

BI-RADS CATEGORY  1: Negative.

## 2019-12-18 DIAGNOSIS — D225 Melanocytic nevi of trunk: Secondary | ICD-10-CM | POA: Diagnosis not present

## 2019-12-18 DIAGNOSIS — D224 Melanocytic nevi of scalp and neck: Secondary | ICD-10-CM | POA: Diagnosis not present

## 2019-12-18 DIAGNOSIS — D2261 Melanocytic nevi of right upper limb, including shoulder: Secondary | ICD-10-CM | POA: Diagnosis not present

## 2019-12-18 DIAGNOSIS — D2271 Melanocytic nevi of right lower limb, including hip: Secondary | ICD-10-CM | POA: Diagnosis not present

## 2020-01-27 DIAGNOSIS — H04301 Unspecified dacryocystitis of right lacrimal passage: Secondary | ICD-10-CM | POA: Diagnosis not present

## 2020-02-08 ENCOUNTER — Ambulatory Visit: Payer: Self-pay | Attending: Internal Medicine

## 2020-02-08 DIAGNOSIS — Z23 Encounter for immunization: Secondary | ICD-10-CM

## 2020-02-08 NOTE — Progress Notes (Signed)
   Covid-19 Vaccination Clinic  Name:  Kristy Stone    MRN: [id number] DOB: 1976-09-15  02/08/2020  Kristy Stone was observed post Covid-19 immunization for 15 minutes without incident. She was provided with Vaccine Information Sheet and instruction to access the V-Safe system.   Kristy Stone was instructed to call 911 with any severe reactions post vaccine: Marland Kitchen Difficulty breathing  . Swelling of face and throat  . A fast heartbeat  . A bad rash all over body  . Dizziness and weakness   Immunizations Administered    Name Date Dose VIS Date Route   Pfizer COVID-19 Vaccine 02/08/2020  1:50 PM 0.3 mL 11/08/2019 Intramuscular   Manufacturer: [place]   Lot: [id number]   [place]: [id number]

## 2020-03-03 ENCOUNTER — Ambulatory Visit: Payer: Self-pay | Attending: Internal Medicine

## 2020-03-03 DIAGNOSIS — Z23 Encounter for immunization: Secondary | ICD-10-CM

## 2020-03-03 NOTE — Progress Notes (Signed)
   Covid-19 Vaccination Clinic  Name:  Chlo… Bleak    MRN: [id number] DOB: 1976/06/12  03/03/2020  Ms. Palen was observed post Covid-19 immunization for 15 minutes without incident. She was provided with Vaccine Information Sheet and instruction to access the V-Safe system.   Ms. Heaston was instructed to call 911 with any severe reactions post vaccine: Marland Kitchen Difficulty breathing  . Swelling of face and throat  . A fast heartbeat  . A bad rash all over body  . Dizziness and weakness   Immunizations Administered    Name Date Dose VIS Date Route   Pfizer COVID-19 Vaccine 03/03/2020  3:06 PM 0.3 mL 11/08/2019 Intramuscular   Manufacturer: [employer]   Lot: [id number]   [place]: [id number]

## 2020-03-10 ENCOUNTER — Telehealth: Payer: Self-pay

## 2020-03-10 NOTE — Telephone Encounter (Signed)
Patient called in regards to wanting to change her prescription of estrogen cream to the estrogen ring.

## 2020-03-10 NOTE — Telephone Encounter (Signed)
Left message for pt to return call to triage RN. 

## 2020-03-11 ENCOUNTER — Telehealth: Payer: Self-pay | Admitting: Obstetrics and Gynecology

## 2020-03-11 MED ORDER — ESTRING 2 MG VA RING: 2.0000 mg | VAGINAL_RING | VAGINAL | 1 refills | Status: DC

## 2020-03-11 NOTE — Telephone Encounter (Signed)
Left message to call Carr Shartzer, RN at [facility] [phone].   

## 2020-03-11 NOTE — Telephone Encounter (Signed)
Patient is returning a call to Jill. °

## 2020-03-11 NOTE — Telephone Encounter (Signed)
Patient states she is returning a call to [facility] regarding prescription.Marland Kitchen

## 2020-03-11 NOTE — Telephone Encounter (Signed)
Call to patient, left detailed message, ok per dpr. Advised as seen below per Dr. Talbert Nan. Rx sent to [facility] on file. Return call to office if you decide alternative RX needed after checking on cost/coverage, or of any additional questions.   Encounter closed.

## 2020-03-11 NOTE — Telephone Encounter (Signed)
That's fine, she can start on the estring. I went to order it and it is not a preferred medication. She could try the vaginal estradiol tablet, or pay extra for the ring.

## 2020-03-11 NOTE — Telephone Encounter (Signed)
Spoke with patient. Patient was seen for [facility] 08/28/19, started on Premarin vaginal cream for vaginal atrophy. Patient states she forget to use the medication, would like to switch to vaginal estrogen ring. Patient states this was discussed as an option at her [facility].   Advised patient Dr. Quincy Simmonds is out of the office, will review with covering provider and return call with recommendations. Patient verbalizes understanding and is agreeable. Confirmed pharmacy on file.   Last MMG 10/08/19: negative, Bi-rads 1  Dr. Talbert Nan -please advise on Rx change.

## 2020-03-12 NOTE — Telephone Encounter (Signed)
Left message to call Tresia Revolorio, RN at [facility] [phone].   

## 2020-03-19 NOTE — Telephone Encounter (Signed)
Left message to call Milaina Sher, RN at [facility] [phone].   

## 2020-03-23 DIAGNOSIS — Z20828 Contact with and (suspected) exposure to other viral communicable diseases: Secondary | ICD-10-CM | POA: Diagnosis not present

## 2020-03-23 DIAGNOSIS — Z03818 Encounter for observation for suspected exposure to other biological agents ruled out: Secondary | ICD-10-CM | POA: Diagnosis not present

## 2020-03-24 DIAGNOSIS — Z03818 Encounter for observation for suspected exposure to other biological agents ruled out: Secondary | ICD-10-CM | POA: Diagnosis not present

## 2020-03-24 NOTE — Telephone Encounter (Signed)
No return call from patient.   Detailed message left for patient on 03/11/20 regarding estring , see telephone encounter dated 03/10/20.   Routing to Dr. Rosann Auerbach.   Encounter closed.

## 2020-09-02 ENCOUNTER — Encounter: Payer: Self-pay | Admitting: Obstetrics and Gynecology

## 2020-09-02 ENCOUNTER — Other Ambulatory Visit: Payer: Self-pay

## 2020-09-02 ENCOUNTER — Ambulatory Visit (INDEPENDENT_AMBULATORY_CARE_PROVIDER_SITE_OTHER): Payer: BC Managed Care – PPO | Admitting: Obstetrics and Gynecology

## 2020-09-02 VITALS — BP 100/64 | HR 76 | Resp 16 | Ht 62.0 in | Wt 122.2 lb

## 2020-09-02 DIAGNOSIS — Z01419 Encounter for gynecological examination (general) (routine) without abnormal findings: Secondary | ICD-10-CM

## 2020-09-02 MED ORDER — ESTRADIOL 10 MCG VA TABS: 1.0000 | ORAL_TABLET | VAGINAL | 3 refills | Status: DC

## 2020-09-02 NOTE — Patient Instructions (Signed)

## 2020-09-02 NOTE — Progress Notes (Signed)
44 y.o. G80P0 Married Caucasian female here for annual exam.    Hot flashes have mostly stopped.  Using estrogen cream at night but not doing it consistently.  Completed her Covid vaccine.   PCP: Janalyn Rouse, MD    Patient's last menstrual period was 11/29/2015.           Sexually active: Yes.    The current method of family planning is post menopausal status.    Exercising: Yes.    walks 2 miles daily and plays tennis Smoker:  no  Health Maintenance: Pap: 08-28-19 Neg:Neg HR HPV, 2017 normal per patient, 01-24-12 Neg History of abnormal Pap:  no MMG:  10-07-19  3D/Neg/density B/BiRads1 Colonoscopy:  n/a BMD:   09-27-16  Result :Normal TDaP:  2017 per patient Gardasil:   no HIV:2014 Neg per patient Hep C: no Screening Labs:  Today.   reports that she has never smoked. She has never used smokeless tobacco. She reports current alcohol use of about 2.0 - 4.0 standard drinks of alcohol per week. She reports that she does not use drugs.  Past Medical History:  Diagnosis Date  . Headache(784.0)   . Premature menopause    age 21    Past Surgical History:  Procedure Laterality Date  . APPENDECTOMY  09/2017  . CESAREAN SECTION  2005 2008  . DILATION AND CURETTAGE OF UTERUS  2007  . DILATION AND EVACUATION  11/25/2011   Procedure: DILATATION AND EVACUATION;  Surgeon: Allyn Kenner, DO;  Location: [facility];  Service: Gynecology;  Laterality: N/A;  dilatation and curettage  . LAPAROSCOPIC APPENDECTOMY N/A 10/27/2017   Procedure: APPENDECTOMY LAPAROSCOPIC;  Surgeon: Ralene Ok, MD;  Location: [facility];  Service: General;  Laterality: N/A;    Current Outpatient Medications  Medication Sig Dispense Refill  . Ascorbic Acid (VITAMIN C PO) Take 1 tablet by mouth daily.    Marland Kitchen ibuprofen (ADVIL,MOTRIN) 200 MG tablet Take 200-400 mg by mouth every 6 (six) hours as needed for headache or mild pain.    . Multiple Vitamins-Calcium (ONE-A-DAY WOMENS FORMULA) TABS Take 1 tablet by mouth  daily.    Derrill Memo ON 09/03/2020] Estradiol (VAGIFEM) 10 MCG TABS vaginal tablet Place 1 tablet (10 mcg total) vaginally 2 (two) times a week. Use every night before bed for two weeks when you first begin this medicine, then after the first two weeks, begin using it twice a week. 34 tablet 3   No current facility-administered medications for this visit.    Family History  Problem Relation Age of Onset  . Cancer Maternal Grandmother     Review of Systems  All other systems reviewed and are negative.   Exam:   BP 106/60   Pulse 76   Resp 16   Ht 5\' 2"  (1.575 m)   Wt 122 lb 3.2 oz (55.4 kg)   LMP 11/29/2015   BMI 22.35 kg/m     General appearance: alert, cooperative and appears stated age Head: normocephalic, without obvious abnormality, atraumatic Neck: no adenopathy, supple, symmetrical, trachea midline and thyroid normal to inspection and palpation Lungs: clear to auscultation bilaterally Breasts: normal appearance, no masses or tenderness, No nipple retraction or dimpling, No nipple discharge or bleeding, No axillary adenopathy Heart: regular rate and rhythm Abdomen: soft, non-tender; no masses, no organomegaly Extremities: extremities normal, atraumatic, no cyanosis or edema Skin: skin color, texture, turgor normal. No rashes or lesions Lymph nodes: cervical, supraclavicular, and axillary nodes normal. Neurologic: grossly normal  Pelvic: External genitalia:  no lesions              No abnormal inguinal nodes palpated.              Urethra:  normal appearing urethra with no masses, tenderness or lesions              Bartholins and Skenes: normal                 Vagina: normal appearing vagina with normal color and discharge, no lesions              Cervix: no lesions              Pap taken: No. Bimanual Exam:  Uterus:  normal size, contour, position, consistency, mobility, non-tender              Adnexa: no mass, fullness, tenderness              Rectal exam: Yes.  .   Confirms.              Anus:  normal sphincter tone, no lesions  Chaperone was present for exam.  Assessment:   Well woman visit with normal exam. Premature menopause. Negative work up at [facility].   Off HRT.  Vaginal atrophy.   Plan: Mammogram screening discussed. Self breast awareness reviewed. Pap and HR HPV as above. Guidelines for Calcium, Vitamin D, regular exercise program including cardiovascular and weight bearing exercise. Routine labs.  Flu vaccine discussed.  Switch to Vagifem tablets.  Instructed in use.  I did review potential effect on breast cancer.  Follow up annually and prn.   After visit summary provided.

## 2020-09-02 NOTE — Progress Notes (Signed)
Patient did not feel well after blood draw. She was given gingerale and some cheese & crackers. I took her B/P and it was 100/64. She states she does this frequently with blood draws. She called her husband to come pick her up.  Routed to provider.

## 2020-09-03 LAB — COMPREHENSIVE METABOLIC PANEL
ALT: 14 IU/L (ref 0–32)
AST: 15 IU/L (ref 0–40)
Albumin/Globulin Ratio: 2.4 — ABNORMAL HIGH (ref 1.2–2.2)
Albumin: 4.8 g/dL (ref 3.8–4.8)
Alkaline Phosphatase: 65 IU/L (ref 44–121)
BUN/Creatinine Ratio: 15 (ref 9–23)
BUN: 12 mg/dL (ref 6–24)
Bilirubin Total: 0.3 mg/dL (ref 0.0–1.2)
CO2: 23 mmol/L (ref 20–29)
Calcium: 9.6 mg/dL (ref 8.7–10.2)
Chloride: 103 mmol/L (ref 96–106)
Creatinine, Ser: 0.8 mg/dL (ref 0.57–1.00)
GFR calc Af Amer: 104 mL/min/{1.73_m2} (ref >59)
GFR calc non Af Amer: 90 mL/min/{1.73_m2} (ref >59)
Globulin, Total: 2 g/dL (ref 1.5–4.5)
Glucose: 96 mg/dL (ref 65–99)
Potassium: 4.2 mmol/L (ref 3.5–5.2)
Sodium: 140 mmol/L (ref 134–144)
Total Protein: 6.8 g/dL (ref 6.0–8.5)

## 2020-09-03 LAB — CBC
Hematocrit: 39.3 % (ref 34.0–46.6)
Hemoglobin: 13.3 g/dL (ref 11.1–15.9)
MCH: 31.3 pg (ref 26.6–33.0)
MCHC: 33.8 g/dL (ref 31.5–35.7)
MCV: 93 fL (ref 79–97)
Platelets: 280 10*3/uL (ref 150–450)
RBC: 4.25 x10E6/uL (ref 3.77–5.28)
RDW: 11.9 % (ref 11.7–15.4)
WBC: 7.1 10*3/uL (ref 3.4–10.8)

## 2020-09-03 LAB — LIPID PANEL
Chol/HDL Ratio: 3.1 ratio (ref 0.0–4.4)
Cholesterol, Total: 215 mg/dL — ABNORMAL HIGH (ref 100–199)
HDL: 69 mg/dL (ref >39)
LDL Chol Calc (NIH): 123 mg/dL — ABNORMAL HIGH (ref 0–99)
Triglycerides: 130 mg/dL (ref 0–149)
VLDL Cholesterol Cal: 23 mg/dL (ref 5–40)

## 2020-09-10 ENCOUNTER — Other Ambulatory Visit: Payer: Self-pay | Admitting: Obstetrics and Gynecology

## 2020-09-10 DIAGNOSIS — Z1231 Encounter for screening mammogram for malignant neoplasm of breast: Secondary | ICD-10-CM

## 2020-10-07 ENCOUNTER — Other Ambulatory Visit: Payer: Self-pay

## 2020-10-07 ENCOUNTER — Ambulatory Visit
Admission: RE | Admit: 2020-10-07 | Discharge: 2020-10-07 | Disposition: A | Discharge status: Home / Self Care | Payer: BC Managed Care – PPO | Source: Ambulatory Visit | Attending: Obstetrics and Gynecology | Admitting: Obstetrics and Gynecology

## 2020-10-07 DIAGNOSIS — Z1231 Encounter for screening mammogram for malignant neoplasm of breast: Secondary | ICD-10-CM

## 2020-11-02 DIAGNOSIS — Z20822 Contact with and (suspected) exposure to covid-19: Secondary | ICD-10-CM | POA: Diagnosis not present

## 2020-11-28 DIAGNOSIS — U071 COVID-19: Secondary | ICD-10-CM

## 2020-11-28 HISTORY — DX: COVID-19: U07.1

## 2020-12-17 DIAGNOSIS — D2271 Melanocytic nevi of right lower limb, including hip: Secondary | ICD-10-CM | POA: Diagnosis not present

## 2020-12-17 DIAGNOSIS — D225 Melanocytic nevi of trunk: Secondary | ICD-10-CM | POA: Diagnosis not present

## 2020-12-17 DIAGNOSIS — D1801 Hemangioma of skin and subcutaneous tissue: Secondary | ICD-10-CM | POA: Diagnosis not present

## 2020-12-17 DIAGNOSIS — D2262 Melanocytic nevi of left upper limb, including shoulder: Secondary | ICD-10-CM | POA: Diagnosis not present

## 2021-09-06 ENCOUNTER — Other Ambulatory Visit: Payer: Self-pay | Admitting: Obstetrics and Gynecology

## 2021-09-06 DIAGNOSIS — Z1231 Encounter for screening mammogram for malignant neoplasm of breast: Secondary | ICD-10-CM

## 2021-09-08 ENCOUNTER — Ambulatory Visit: Payer: BC Managed Care – PPO | Admitting: Obstetrics and Gynecology

## 2021-09-13 NOTE — Progress Notes (Signed)
45 y.o. G78P0 Married Caucasian female here for annual exam.    Using Vagifem but regularly.  Wants to continue.   Having some joint pain in elbows.   Two children in high school.  PCP:  Lendon Ka. Brigitte Pulse, MD  Patient's last menstrual period was 11/29/2015.           Sexually active: Yes.    The current method of family planning is post menopausal status.    Exercising: Yes.     Walking, tennis Smoker:  no  Health Maintenance: Pap:   08-28-19 Neg:Neg HR HPV, 2017 normal per patient, 01-24-12 Neg History of abnormal Pap:  no MMG: 10-07-20 3D/Neg/Birads1.  Has appointment 10/08/21. Colonoscopy:  n/a BMD:  2017  Result :Normal TDaP:  2017 per patient Gardasil:   no HIV: 2014 NR Hep C: no Screening Labs: PCP Covid vaccine:  completed original series.  Flu vaccine:  not today.    reports that she has never smoked. She has never used smokeless tobacco. She reports current alcohol use of about 2.0 - 3.0 standard drinks per week. She reports that she does not use drugs.  Past Medical History:  Diagnosis Date   COVID 11/2020   Headache(784.0)    Premature menopause    age 83    Past Surgical History:  Procedure Laterality Date   APPENDECTOMY  09/2017   CESAREAN SECTION  2005 2008   DILATION AND CURETTAGE OF UTERUS  2007   DILATION AND EVACUATION  11/25/2011   Procedure: DILATATION AND EVACUATION;  Surgeon: Allyn Kenner, DO;  Location: [facility];  Service: Gynecology;  Laterality: N/A;  dilatation and curettage   LAPAROSCOPIC APPENDECTOMY N/A 10/27/2017   Procedure: APPENDECTOMY LAPAROSCOPIC;  Surgeon: Ralene Ok, MD;  Location: [facility];  Service: General;  Laterality: N/A;    Current Outpatient Medications  Medication Sig Dispense Refill   Ascorbic Acid (VITAMIN C PO) Take 1 tablet by mouth daily.     Estradiol (VAGIFEM) 10 MCG TABS vaginal tablet Place 1 tablet (10 mcg total) vaginally 2 (two) times a week. Use every night before bed for two weeks when you first begin  this medicine, then after the first two weeks, begin using it twice a week. 34 tablet 3   ibuprofen (ADVIL,MOTRIN) 200 MG tablet Take 200-400 mg by mouth every 6 (six) hours as needed for headache or mild pain.     Multiple Vitamins-Calcium (ONE-A-DAY WOMENS FORMULA) TABS Take 1 tablet by mouth daily.     No current facility-administered medications for this visit.    Family History  Problem Relation Age of Onset   Cancer Maternal Grandmother     Review of Systems  All other systems reviewed and are negative.  Exam:   BP 110/64   Pulse (!) 106   Ht 5\' 2"  (1.575 m)   Wt 136 lb (61.7 kg)   LMP 11/29/2015   SpO2 99%   BMI 24.87 kg/m     General appearance: alert, cooperative and appears stated age Head: normocephalic, without obvious abnormality, atraumatic Neck: no adenopathy, supple, symmetrical, trachea midline and thyroid normal to inspection and palpation Lungs: clear to auscultation bilaterally Breasts: normal appearance, no masses or tenderness, No nipple retraction or dimpling, No nipple discharge or bleeding, No axillary adenopathy Heart: regular rate and rhythm Abdomen: soft, non-tender; no masses, no organomegaly Extremities: extremities normal, atraumatic, no cyanosis or edema Skin: skin color, texture, turgor normal. No rashes or lesions Lymph nodes: cervical, supraclavicular, and axillary nodes normal. Neurologic:  grossly normal  Pelvic: External genitalia:  no lesions              No abnormal inguinal nodes palpated.              Urethra:  normal appearing urethra with no masses, tenderness or lesions              Bartholins and Skenes: normal                 Vagina: normal appearing vagina with normal color and discharge, no lesions              Cervix: no lesions              Pap taken:  no Bimanual Exam:  Uterus:  normal size, contour, position, consistency, mobility, non-tender              Adnexa: no mass, fullness, tenderness              Rectal exam:  yes.  Confirms.              Anus:  normal sphincter tone, no lesions  Chaperone was present for exam:  Estill Bamberg, CMA  Assessment:   Well woman visit with gynecologic exam. Premature menopause. Negative work up at [facility].   Off HRT.  Vaginal atrophy.  Joint pain.   Plan: Mammogram screening discussed. Self breast awareness reviewed. Pap and HR HPV 2025. Guidelines for Calcium, Vitamin D, regular exercise program including cardiovascular and weight bearing exercise. Switch to Estring.   Instructed in use.  BMD age 78. She will make an appointment to see her PCP.  Follow up annually and prn.    After visit summary provided.

## 2021-09-14 ENCOUNTER — Encounter: Payer: Self-pay | Admitting: Obstetrics and Gynecology

## 2021-09-14 ENCOUNTER — Ambulatory Visit (INDEPENDENT_AMBULATORY_CARE_PROVIDER_SITE_OTHER): Payer: BC Managed Care – PPO | Admitting: Obstetrics and Gynecology

## 2021-09-14 ENCOUNTER — Other Ambulatory Visit: Payer: Self-pay

## 2021-09-14 VITALS — BP 110/64 | HR 106 | Ht 62.0 in | Wt 136.0 lb

## 2021-09-14 DIAGNOSIS — Z01419 Encounter for gynecological examination (general) (routine) without abnormal findings: Secondary | ICD-10-CM

## 2021-09-14 MED ORDER — ESTRING 2 MG VA RING: 2.0000 mg | VAGINAL_RING | VAGINAL | 3 refills | Status: DC

## 2021-09-14 NOTE — Patient Instructions (Signed)

## 2021-09-21 DIAGNOSIS — E785 Hyperlipidemia, unspecified: Secondary | ICD-10-CM | POA: Diagnosis not present

## 2021-09-30 DIAGNOSIS — R82998 Other abnormal findings in urine: Secondary | ICD-10-CM | POA: Diagnosis not present

## 2021-09-30 DIAGNOSIS — E785 Hyperlipidemia, unspecified: Secondary | ICD-10-CM | POA: Diagnosis not present

## 2021-09-30 DIAGNOSIS — Z1331 Encounter for screening for depression: Secondary | ICD-10-CM | POA: Diagnosis not present

## 2021-09-30 DIAGNOSIS — Z0000 Encounter for general adult medical examination without abnormal findings: Secondary | ICD-10-CM | POA: Diagnosis not present

## 2021-09-30 DIAGNOSIS — Z1339 Encounter for screening examination for other mental health and behavioral disorders: Secondary | ICD-10-CM | POA: Diagnosis not present

## 2021-09-30 DIAGNOSIS — R7301 Impaired fasting glucose: Secondary | ICD-10-CM | POA: Diagnosis not present

## 2021-10-08 ENCOUNTER — Ambulatory Visit
Admission: RE | Admit: 2021-10-08 | Discharge: 2021-10-08 | Disposition: A | Discharge status: Home / Self Care | Payer: BC Managed Care – PPO | Source: Ambulatory Visit | Attending: Obstetrics and Gynecology | Admitting: Obstetrics and Gynecology

## 2021-10-08 DIAGNOSIS — Z1231 Encounter for screening mammogram for malignant neoplasm of breast: Secondary | ICD-10-CM | POA: Diagnosis not present

## 2021-11-09 DIAGNOSIS — R059 Cough, unspecified: Secondary | ICD-10-CM | POA: Diagnosis not present

## 2021-11-09 DIAGNOSIS — H9202 Otalgia, left ear: Secondary | ICD-10-CM | POA: Diagnosis not present

## 2021-12-08 IMAGING — MG DIGITAL SCREENING BILAT W/ TOMO W/ CAD
6 of 10 series · 6 of 30 positions shown · non-contrast
Comparison: Previous exam(s).

CLINICAL DATA: Screening.

EXAM:
DIGITAL SCREENING BILATERAL MAMMOGRAM WITH TOMO AND CAD

[R MLO synth-2D (1 of 2)]
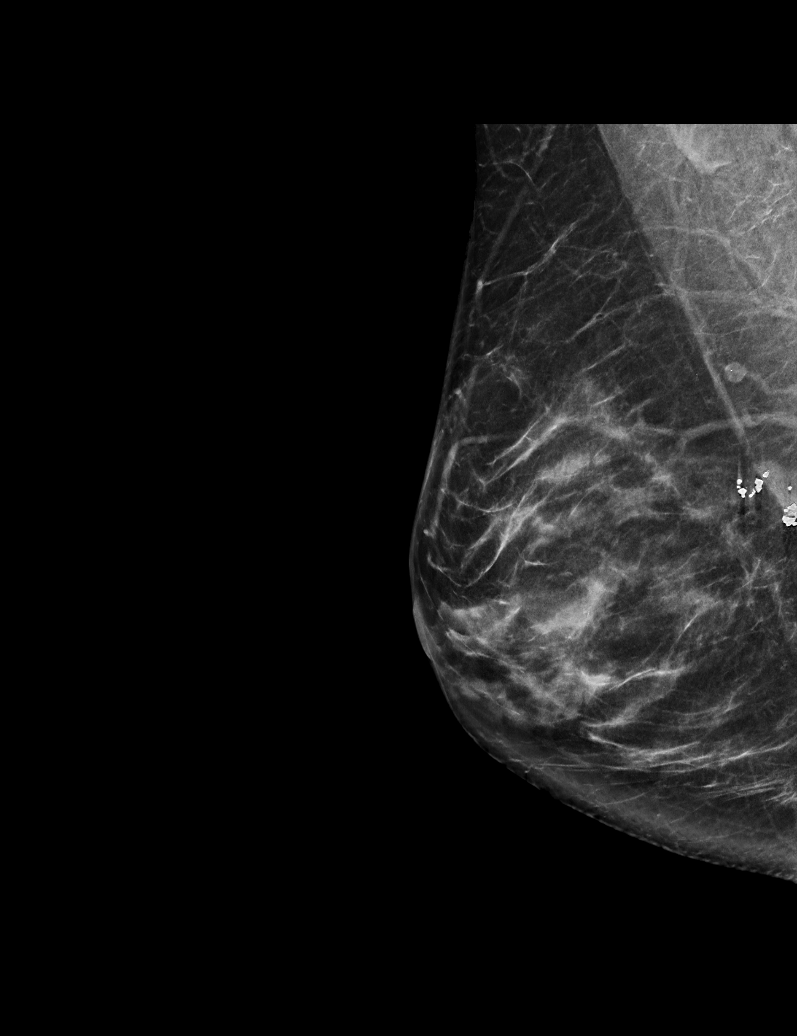

[R MLO synth-2D (2 of 2)]
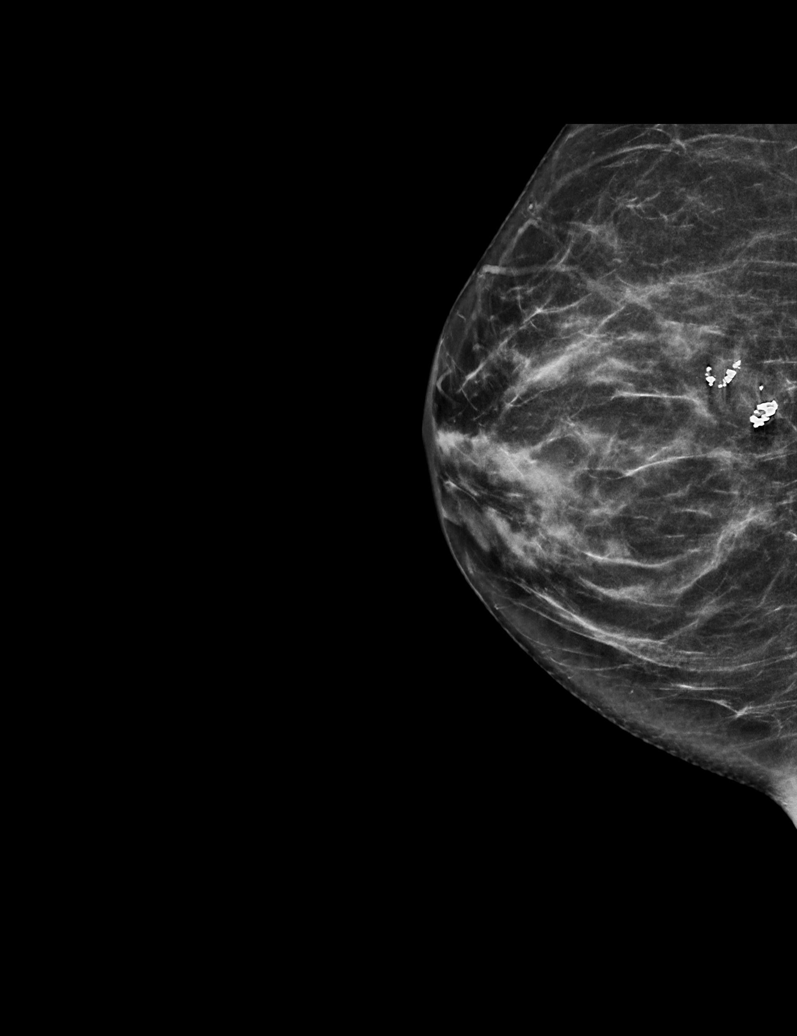

[R CC synth-2D]
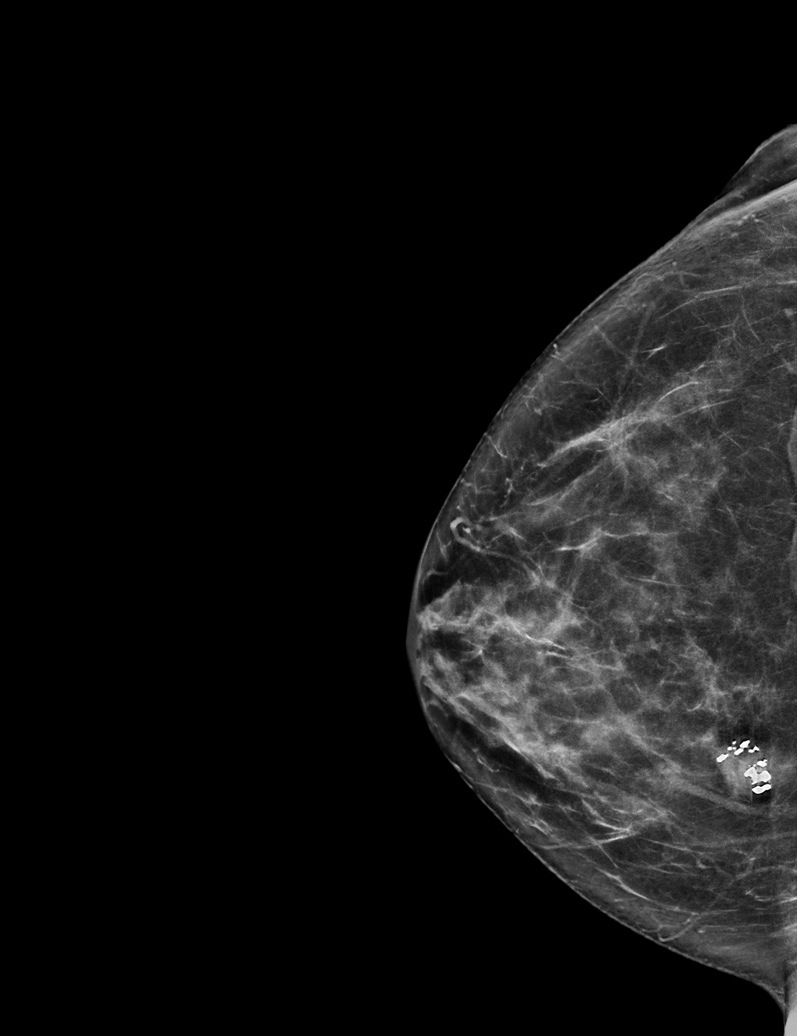

[L CC synth-2D]
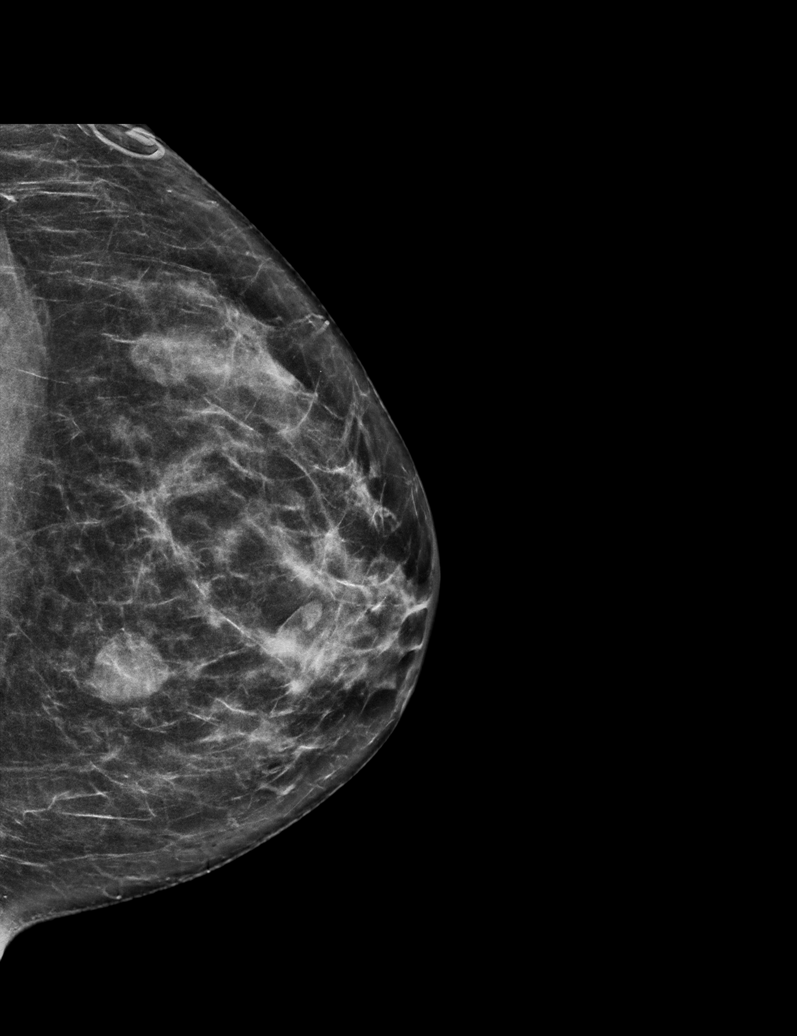

[L MLO synth-2D]
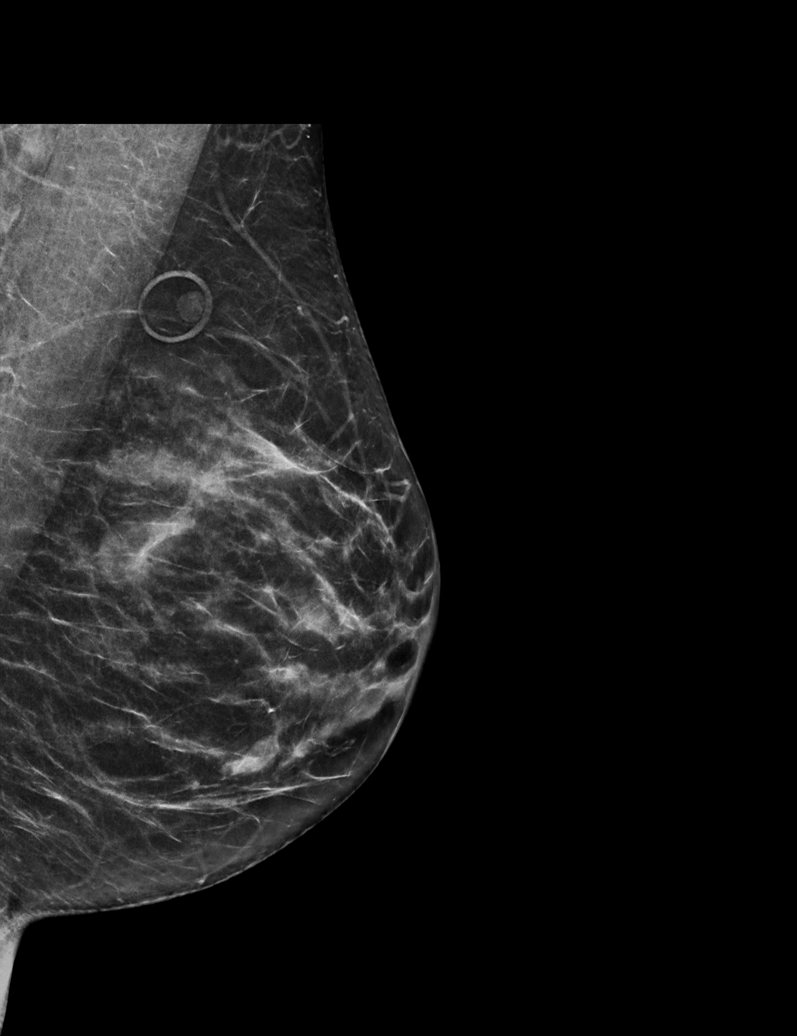

[R CC tomo · tomo slice 33/65.0]
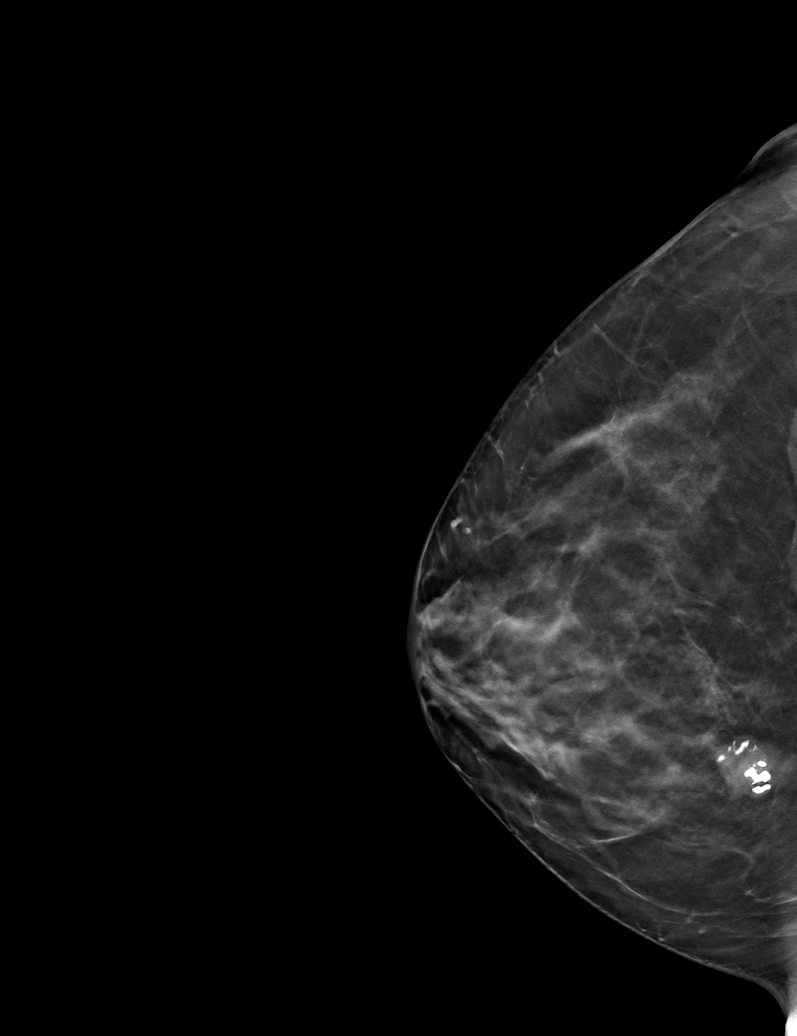

[6 of 30 positions shown; findings below may reference images not displayed]

ACR Breast Density Category b: There are scattered areas of
fibroglandular density.
FINDINGS: There are no findings suspicious for malignancy. Images were
processed with CAD.
IMPRESSION: No mammographic evidence of malignancy. A result letter of this
screening mammogram will be mailed directly to the patient.

RECOMMENDATION:
Screening mammogram in one year. (Code:[id number])

BI-RADS CATEGORY  1: Negative.

## 2021-12-21 DIAGNOSIS — D485 Neoplasm of uncertain behavior of skin: Secondary | ICD-10-CM | POA: Diagnosis not present

## 2021-12-21 DIAGNOSIS — D225 Melanocytic nevi of trunk: Secondary | ICD-10-CM | POA: Diagnosis not present

## 2021-12-21 DIAGNOSIS — D2262 Melanocytic nevi of left upper limb, including shoulder: Secondary | ICD-10-CM | POA: Diagnosis not present

## 2021-12-21 DIAGNOSIS — D2261 Melanocytic nevi of right upper limb, including shoulder: Secondary | ICD-10-CM | POA: Diagnosis not present

## 2021-12-21 DIAGNOSIS — D2272 Melanocytic nevi of left lower limb, including hip: Secondary | ICD-10-CM | POA: Diagnosis not present

## 2021-12-21 DIAGNOSIS — L814 Other melanin hyperpigmentation: Secondary | ICD-10-CM | POA: Diagnosis not present

## 2022-04-12 DIAGNOSIS — Z6825 Body mass index (BMI) 25.0-25.9, adult: Secondary | ICD-10-CM | POA: Diagnosis not present

## 2022-04-12 DIAGNOSIS — R635 Abnormal weight gain: Secondary | ICD-10-CM | POA: Diagnosis not present

## 2022-04-12 DIAGNOSIS — E559 Vitamin D deficiency, unspecified: Secondary | ICD-10-CM | POA: Diagnosis not present

## 2022-04-12 DIAGNOSIS — R5383 Other fatigue: Secondary | ICD-10-CM | POA: Diagnosis not present

## 2022-04-12 DIAGNOSIS — N951 Menopausal and female climacteric states: Secondary | ICD-10-CM | POA: Diagnosis not present

## 2022-04-12 DIAGNOSIS — E785 Hyperlipidemia, unspecified: Secondary | ICD-10-CM | POA: Diagnosis not present

## 2022-04-21 DIAGNOSIS — R635 Abnormal weight gain: Secondary | ICD-10-CM | POA: Diagnosis not present

## 2022-04-21 DIAGNOSIS — N898 Other specified noninflammatory disorders of vagina: Secondary | ICD-10-CM | POA: Diagnosis not present

## 2022-04-21 DIAGNOSIS — Z1331 Encounter for screening for depression: Secondary | ICD-10-CM | POA: Diagnosis not present

## 2022-04-21 DIAGNOSIS — R6882 Decreased libido: Secondary | ICD-10-CM | POA: Diagnosis not present

## 2022-04-21 DIAGNOSIS — N951 Menopausal and female climacteric states: Secondary | ICD-10-CM | POA: Diagnosis not present

## 2022-04-21 DIAGNOSIS — Z1339 Encounter for screening examination for other mental health and behavioral disorders: Secondary | ICD-10-CM | POA: Diagnosis not present

## 2022-05-09 DIAGNOSIS — E785 Hyperlipidemia, unspecified: Secondary | ICD-10-CM | POA: Diagnosis not present

## 2022-05-09 DIAGNOSIS — Z6825 Body mass index (BMI) 25.0-25.9, adult: Secondary | ICD-10-CM | POA: Diagnosis not present

## 2022-07-01 ENCOUNTER — Other Ambulatory Visit: Payer: Self-pay

## 2022-07-01 DIAGNOSIS — N952 Postmenopausal atrophic vaginitis: Secondary | ICD-10-CM

## 2022-07-01 MED ORDER — ESTRADIOL 10 MCG VA TABS: 1.0000 | ORAL_TABLET | VAGINAL | 0 refills | Status: DC

## 2022-07-01 NOTE — Telephone Encounter (Signed)
Pt notified and voiced understanding. Will send schedulers a msg to go ahead and contact pt to set up annual exam appt.

## 2022-07-01 NOTE — Telephone Encounter (Signed)
Pt calling to request refills on vagifem tabs. However, I noticed it was estring in her med list so I called pt to confirm. Per pt, the estring was NOT covered by her insurance so she changed back to vagifem 40mg tablets.  Last AEX 09/14/2021 Last mammo 10/08/2021-neg birads 1.

## 2022-09-08 DIAGNOSIS — J029 Acute pharyngitis, unspecified: Secondary | ICD-10-CM | POA: Diagnosis not present

## 2022-09-08 DIAGNOSIS — Z6824 Body mass index (BMI) 24.0-24.9, adult: Secondary | ICD-10-CM | POA: Diagnosis not present

## 2022-09-08 DIAGNOSIS — H66002 Acute suppurative otitis media without spontaneous rupture of ear drum, left ear: Secondary | ICD-10-CM | POA: Diagnosis not present

## 2022-09-09 ENCOUNTER — Other Ambulatory Visit: Payer: Self-pay | Admitting: Obstetrics and Gynecology

## 2022-09-09 DIAGNOSIS — Z1231 Encounter for screening mammogram for malignant neoplasm of breast: Secondary | ICD-10-CM

## 2022-09-20 ENCOUNTER — Ambulatory Visit: Payer: BC Managed Care – PPO | Admitting: Obstetrics and Gynecology

## 2022-09-22 ENCOUNTER — Encounter: Payer: Self-pay | Admitting: Internal Medicine

## 2022-09-28 ENCOUNTER — Ambulatory Visit: Payer: BC Managed Care – PPO | Admitting: Obstetrics and Gynecology

## 2022-10-04 ENCOUNTER — Ambulatory Visit (INDEPENDENT_AMBULATORY_CARE_PROVIDER_SITE_OTHER): Payer: BC Managed Care – PPO | Admitting: Obstetrics and Gynecology

## 2022-10-04 ENCOUNTER — Encounter: Payer: Self-pay | Admitting: Obstetrics and Gynecology

## 2022-10-04 VITALS — BP 110/68 | HR 76 | Ht 61.75 in | Wt 141.0 lb

## 2022-10-04 DIAGNOSIS — N952 Postmenopausal atrophic vaginitis: Secondary | ICD-10-CM

## 2022-10-04 DIAGNOSIS — Z01419 Encounter for gynecological examination (general) (routine) without abnormal findings: Secondary | ICD-10-CM | POA: Diagnosis not present

## 2022-10-04 MED ORDER — ESTRADIOL 10 MCG VA TABS: 1.0000 | ORAL_TABLET | VAGINAL | 3 refills | Status: DC

## 2022-10-04 NOTE — Progress Notes (Signed)
46 y.o. G75P0 Married G54P0 Caucasian female here for annual exam.     Likes the Vagifem.  Using it at least once a week.   PCP: Marton Redwood, MD  Patient's last menstrual period was 11/29/2015.          Sexually active: Yes.    The current method of family planning is post menopausal status.    Exercising: Yes.     Walking & tennis Smoker:  no  Health Maintenance: Pap:  08-28-19 neg HPV HR neg, 01-24-12 neg History of abnormal Pap:  no MMG:  10-08-21 category b density birads 1:neg.  Has appointment for 10/26/22.  BMD:   09-27-16 normal Colonoscopy: none, scheduled for 12/23 TDaP:  2017 per patient Gardasil: not done Routine labs:  PCP Flu vaccine:  declines  Covid vaccine:  discussed   reports that she has never smoked. She has never used smokeless tobacco. She reports current alcohol use of about 2.0 - 3.0 standard drinks of alcohol per week. She reports that she does not use drugs.  Past Medical History:  Diagnosis Date   COVID 11/2020   Headache(784.0)    Premature menopause    age 30    Past Surgical History:  Procedure Laterality Date   APPENDECTOMY  09/2017   BREAST BIOPSY Left    CESAREAN SECTION  2005 2008   DILATION AND CURETTAGE OF UTERUS  2007   DILATION AND EVACUATION  11/25/2011   Procedure: DILATATION AND EVACUATION;  Surgeon: Allyn Kenner, DO;  Location: [facility];  Service: Gynecology;  Laterality: N/A;  dilatation and curettage   LAPAROSCOPIC APPENDECTOMY N/A 10/27/2017   Procedure: APPENDECTOMY LAPAROSCOPIC;  Surgeon: Ralene Ok, MD;  Location: [facility];  Service: General;  Laterality: N/A;    Current Outpatient Medications  Medication Sig Dispense Refill   Estradiol 10 MCG TABS vaginal tablet Place 1 tablet (10 mcg total) vaginally 2 (two) times a week. Please make appointment for further refills. 16 tablet 0   ibuprofen (ADVIL,MOTRIN) 200 MG tablet Take 200-400 mg by mouth every 6 (six) hours as needed for headache or mild pain.     MAGNESIUM PO  Take by mouth.     Multiple Vitamins-Calcium (ONE-A-DAY WOMENS FORMULA) TABS Take 1 tablet by mouth daily.     Prasterone, DHEA, (DHEA PO) Take by mouth.     VITAMIN D PO Take by mouth.     No current facility-administered medications for this visit.    Family History  Problem Relation Age of Onset   Cancer Maternal Grandmother     Review of Systems  Constitutional: Negative.   HENT: Negative.    Eyes: Negative.   Respiratory: Negative.    Cardiovascular: Negative.   Gastrointestinal: Negative.   Endocrine: Negative.   Genitourinary: Negative.   Musculoskeletal: Negative.   Skin: Negative.   Allergic/Immunologic: Negative.   Neurological: Negative.   Hematological: Negative.   Psychiatric/Behavioral: Negative.      Exam:   BP 110/68   Pulse 76   Ht 5' 1.75" (1.568 m)   Wt 141 lb (64 kg)   LMP 11/29/2015   SpO2 98%   BMI 26.00 kg/m   Weight change: '@WEIGHTCHANGE'$ @ Height:   Height: 5' 1.75" (156.8 cm)  Ht Readings from Last 3 Encounters:  10/04/22 5' 1.75" (1.568 m)  09/14/21 '5\' 2"'$  (1.575 m)  09/02/20 '5\' 2"'$  (1.575 m)    General appearance: alert, cooperative and appears stated age Head: Normocephalic, without obvious abnormality, atraumatic Neck: no adenopathy, supple,  symmetrical, trachea midline and thyroid normal to inspection and palpation Lungs: clear to auscultation bilaterally Cardiovascular: regular rate and rhythm Breasts: normal appearance, no masses or tenderness, No axillary or supraclavicular adenopathy Abdomen: soft, non-tender; non distended,  no masses,  no organomegaly Extremities: extremities normal, atraumatic, no cyanosis or edema Skin: Skin color, texture, turgor normal. No rashes or lesions Lymph nodes: Cervical, supraclavicular, and axillary nodes normal. No abnormal inguinal nodes palpated Neurologic: Grossly normal  Pelvic: External genitalia:  no lesions              Urethra:  normal appearing urethra with no masses, tenderness or  lesions              Bartholins and Skenes: normal                 Vagina: normal appearing vagina with normal color and discharge, no lesions              Cervix: no lesions               Bimanual Exam:  Uterus:  normal size, contour, position, consistency, mobility, non-tender              Adnexa: no mass, fullness, tenderness               Rectovaginal: Confirms               Anus:  normal sphincter tone, no lesions  Joy, CMA chaperoned for the exam.  A:  Well Woman with normal exam             Premature menopause. Negative work up at [facility].               Off HRT.              Vaginal atrophy.    P:   Pap in 2025.              Mammogram screening discussed.              SBE reviewed.              Refill of Vagifem pv twice weekly for one year.  .  I discussed potential effect on breast cancer.              BMD age 23 yo.              Labs with PCP.             Fu yearly and prn.

## 2022-10-04 NOTE — Patient Instructions (Signed)

## 2022-10-11 ENCOUNTER — Ambulatory Visit (AMBULATORY_SURGERY_CENTER): Payer: Self-pay

## 2022-10-11 VITALS — Ht 62.0 in | Wt 144.0 lb

## 2022-10-11 DIAGNOSIS — Z1211 Encounter for screening for malignant neoplasm of colon: Secondary | ICD-10-CM

## 2022-10-11 MED ORDER — NA SULFATE-K SULFATE-MG SULF 17.5-3.13-1.6 GM/177ML PO SOLN: 1.0000 | Freq: Once | ORAL | 0 refills | Status: AC

## 2022-10-11 NOTE — Progress Notes (Signed)
No egg or soy allergy known to patient;  No issues known to pt with past sedation with any surgeries or procedures; Patient denies ever being told they had issues or difficulty with intubation;  No FH of Malignant Hyperthermia; Pt is not on diet pills; Pt is not on home 02;  Pt is not on blood thinners;  Pt denies issues with constipation;  No A fib or A flutter; Have any cardiac testing pending--NO Pt instructed to use [email or web address] or GoodRx for a price reduction on prep;   Insurance verified during [facility] appt=BCBS   Patient's chart reviewed by Osvaldo Angst CNRA prior to previsit and patient appropriate for the [facility].  Previsit completed and red dot placed by patient's name on their procedure day (on provider's schedule).    GoodRx coupon given to patient during [facility] appt for [employer] per patient request;

## 2022-10-26 ENCOUNTER — Ambulatory Visit
Admission: RE | Admit: 2022-10-26 | Discharge: 2022-10-26 | Disposition: A | Discharge status: Home / Self Care | Payer: BC Managed Care – PPO | Source: Ambulatory Visit | Attending: Obstetrics and Gynecology | Admitting: Obstetrics and Gynecology

## 2022-10-26 DIAGNOSIS — Z1231 Encounter for screening mammogram for malignant neoplasm of breast: Secondary | ICD-10-CM

## 2022-11-08 ENCOUNTER — Encounter: Payer: BC Managed Care – PPO | Admitting: Internal Medicine

## 2023-01-03 ENCOUNTER — Ambulatory Visit (AMBULATORY_SURGERY_CENTER): Payer: BC Managed Care – PPO | Admitting: *Deleted

## 2023-01-03 ENCOUNTER — Encounter: Payer: BC Managed Care – PPO | Admitting: Gastroenterology

## 2023-01-03 VITALS — Ht 62.0 in | Wt 140.0 lb

## 2023-01-03 DIAGNOSIS — Z1211 Encounter for screening for malignant neoplasm of colon: Secondary | ICD-10-CM

## 2023-01-03 NOTE — Progress Notes (Signed)
No egg or soy allergy known to patient  No issues known to pt with past sedation with any surgeries or procedures Patient denies ever being told they had issues or difficulty with intubation  No FH of Malignant Hyperthermia Pt is not on diet pills Pt is not on  home 02  Pt is not on blood thinners  Pt denies issues with constipation  No A fib or A flutter Have any cardiac testing pending--no Pt instructed to use [email or web address] or GoodRx for a price reduction on prep   Pt.already had suprep @ home from previous appointment.   Patient's chart reviewed by Osvaldo Angst CNRA prior to previsit and patient appropriate for the [facility].  Previsit completed and red dot placed by patient's name on their procedure day (on provider's schedule).

## 2023-01-18 ENCOUNTER — Encounter: Payer: Self-pay | Admitting: Gastroenterology

## 2023-02-01 ENCOUNTER — Encounter: Payer: Self-pay | Admitting: Gastroenterology

## 2023-02-01 ENCOUNTER — Ambulatory Visit (AMBULATORY_SURGERY_CENTER): Payer: BC Managed Care – PPO | Admitting: Gastroenterology

## 2023-02-01 VITALS — BP 95/62 | HR 67 | Temp 97.8°F | Resp 17 | Ht 62.0 in | Wt 140.0 lb

## 2023-02-01 DIAGNOSIS — D123 Benign neoplasm of transverse colon: Secondary | ICD-10-CM

## 2023-02-01 DIAGNOSIS — Z1211 Encounter for screening for malignant neoplasm of colon: Secondary | ICD-10-CM

## 2023-02-01 MED ORDER — SODIUM CHLORIDE 0.9 % IV SOLN: 500.0000 mL | Freq: Once | INTRAVENOUS | Status: DC

## 2023-02-01 NOTE — Op Note (Addendum)
[facility] Patient Name: Kristy Stone Procedure Date: 02/01/2023 7:21 AM MRN: [id number] Endoscopist: Mauri Pole , MD, [id number] Age: 47 Referring MD:  Date of Birth: Nov 21, 1976 Gender: Female Account #: [id number] Procedure:                Colonoscopy Indications:              Screening for colorectal malignant neoplasm Medicines:                Monitored Anesthesia Care Procedure:                Pre-Anesthesia Assessment:                           - Prior to the procedure, a History and Physical                            was performed, and patient medications and                            allergies were reviewed. The patient's tolerance of                            previous anesthesia was also reviewed. The risks                            and benefits of the procedure and the sedation                            options and risks were discussed with the patient.                            All questions were answered, and informed consent                            was obtained. Prior Anticoagulants: The patient has                            taken no anticoagulant or antiplatelet agents. ASA                            Grade Assessment: II - A patient with mild systemic                            disease. After reviewing the risks and benefits,                            the patient was deemed in satisfactory condition to                            undergo the procedure.                           After obtaining informed consent, the colonoscope  was passed under direct vision. Throughout the                            procedure, the patient's blood pressure, pulse, and                            oxygen saturations were monitored continuously. The                            Olympus [id number]) Colonoscope was                            introduced through the anus and advanced to the the                            cecum,  identified by appendiceal orifice and                            ileocecal valve. The colonoscopy was performed                            without difficulty. The patient tolerated the                            procedure well. The quality of the bowel                            preparation was good. The ileocecal valve,                            appendiceal orifice, and rectum were photographed. Scope In: 8:16:11 AM Scope Out: 8:27:22 AM Scope Withdrawal Time: 0 hours 6 minutes 29 seconds  Total Procedure Duration: 0 hours 11 minutes 11 seconds  Findings:                 The perianal and digital rectal examinations were                            normal.                           A 5 mm polyp was found in the transverse colon. The                            polyp was sessile. The polyp was removed with a                            cold snare. Resection and retrieval were complete.                           Scattered small-mouthed diverticula were found in                            the sigmoid colon, descending colon, transverse  colon and ascending colon. There was evidence of an                            impacted diverticulum.                           Non-bleeding external and internal hemorrhoids were                            found during retroflexion. The hemorrhoids were                            small. Complications:            No immediate complications. Estimated Blood Loss:     Estimated blood loss was minimal. Impression:               - One 5 mm polyp in the transverse colon, removed                            with a cold snare. Resected and retrieved.                           - Moderate diverticulosis in the sigmoid colon, in                            the descending colon, in the transverse colon and                            in the ascending colon. There was evidence of an                            impacted diverticulum.                            - Non-bleeding external and internal hemorrhoids. Recommendation:           - Patient has a contact number available for                            emergencies. The signs and symptoms of potential                            delayed complications were discussed with the                            patient. Return to normal activities tomorrow.                            Written discharge instructions were provided to the                            patient.                           - Resume previous diet.                           -  Continue present medications.                           - Await pathology results.                           - Repeat colonoscopy in 5-10 years for surveillance                            based on pathology results. Mauri Pole, MD 02/01/2023 8:35:37 AM This report has been signed electronically.

## 2023-02-01 NOTE — Progress Notes (Signed)
Called to room to assist during endoscopic procedure.  Patient ID and intended procedure confirmed with present staff. Received instructions for my participation in the procedure from the performing physician.  

## 2023-02-01 NOTE — Progress Notes (Unsigned)
Uneventful anesthetic. Report to pacu rn. Vss. Care resumed by rn.

## 2023-02-01 NOTE — Patient Instructions (Signed)
Please read handouts provided. Continue present medications. Await pathology results.   YOU HAD AN ENDOSCOPIC PROCEDURE TODAY AT THE [facility]:   Refer to the procedure report that was given to you for any specific questions about what was found during the examination.  If the procedure report does not answer your questions, please call your gastroenterologist to clarify.  If you requested that your care partner not be given the details of your procedure findings, then the procedure report has been included in a sealed envelope for you to review at your convenience later.  YOU SHOULD EXPECT: Some feelings of bloating in the abdomen. Passage of more gas than usual.  Walking can help get rid of the air that was put into your GI tract during the procedure and reduce the bloating. If you had a lower endoscopy (such as a colonoscopy or flexible sigmoidoscopy) you may notice spotting of blood in your stool or on the toilet paper. If you underwent a bowel prep for your procedure, you may not have a normal bowel movement for a few days.  Please Note:  You might notice some irritation and congestion in your nose or some drainage.  This is from the oxygen used during your procedure.  There is no need for concern and it should clear up in a day or so.  SYMPTOMS TO REPORT IMMEDIATELY:  Following lower endoscopy (colonoscopy or flexible sigmoidoscopy):  Excessive amounts of blood in the stool  Significant tenderness or worsening of abdominal pains  Swelling of the abdomen that is new, acute  Fever of 100F or higher   For urgent or emergent issues, a gastroenterologist can be reached at any hour by calling [phone]. Do not use MyChart messaging for urgent concerns.    DIET:  We do recommend a small meal at first, but then you may proceed to your regular diet.  Drink plenty of fluids but you should avoid alcoholic beverages for 24 hours.  ACTIVITY:  You should plan to take it easy  for the rest of today and you should NOT DRIVE or use heavy machinery until tomorrow (because of the sedation medicines used during the test).    FOLLOW UP: Our staff will call the number listed on your records the next business day following your procedure.  We will call around 7:15- 8:00 am to check on you and address any questions or concerns that you may have regarding the information given to you following your procedure. If we do not reach you, we will leave a message.     If any biopsies were taken you will be contacted by phone or by letter within the next 1-3 weeks.  Please call us at [phone] if you have not heard about the biopsies in 3 weeks.    SIGNATURES/CONFIDENTIALITY: You and/or your care partner have signed paperwork which will be entered into your electronic medical record.  These signatures attest to the fact that that the information above on your After Visit Summary has been reviewed and is understood.  Full responsibility of the confidentiality of this discharge information lies with you and/or your care-partner. 

## 2023-02-01 NOTE — Progress Notes (Unsigned)
Pt's states no medical or surgical changes since previsit or office visit. 

## 2023-02-01 NOTE — Progress Notes (Unsigned)
[facility] History and Physical   Primary Care Physician:  Ginger Organ., MD   Reason for Procedure:  Colorectal cancer screening  Plan:    Screening colonoscopy with possible interventions as needed     HPI: Kristy Stone is a very pleasant 47 y.o. female here for screening colonoscopy. Denies any nausea, vomiting, abdominal pain, melena or bright red blood per rectum  The risks and benefits as well as alternatives of endoscopic procedure(s) have been discussed and reviewed. All questions answered. The patient agrees to proceed.    Past Medical History:  Diagnosis Date   COVID 11/2020   Headache(784.0)    Hyperlipidemia    diet controlled   Premature menopause    age 57    Past Surgical History:  Procedure Laterality Date   BREAST BIOPSY Left    CESAREAN SECTION  2005   and 2008 x2   DILATION AND CURETTAGE OF UTERUS  2007   DILATION AND EVACUATION  11/25/2011   Procedure: DILATATION AND EVACUATION;  Surgeon: Allyn Kenner, DO;  Location: [facility];  Service: Gynecology;  Laterality: N/A;  dilatation and curettage   LAPAROSCOPIC APPENDECTOMY N/A 10/27/2017   Procedure: APPENDECTOMY LAPAROSCOPIC;  Surgeon: Ralene Ok, MD;  Location: [facility];  Service: General;  Laterality: N/A;   WISDOM TOOTH EXTRACTION      Prior to Admission medications   Medication Sig Start Date End Date Taking? Authorizing Provider  Estradiol 10 MCG TABS vaginal tablet Place 1 tablet (10 mcg total) vaginally 2 (two) times a week. 10/06/22  Yes Nunzio Cobbs, MD  loratadine (CLARITIN) 10 MG tablet Take 10 mg by mouth daily as needed for allergies.   Yes [provider]  MAGNESIUM PO Take 2 tablets by mouth daily at 6 (six) AM.   Yes [provider]  Multiple Vitamins-Calcium (ONE-A-DAY WOMENS FORMULA) TABS Take 1 tablet by mouth daily.   Yes [provider]  Prasterone, DHEA, (DHEA PO) Take 5 mg by mouth daily at 6 (six) AM.   Yes  [provider]  VITAMIN D PO Take 125 mg by mouth daily at 6 (six) AM.   Yes [provider]    Current Outpatient Medications  Medication Sig Dispense Refill   Estradiol 10 MCG TABS vaginal tablet Place 1 tablet (10 mcg total) vaginally 2 (two) times a week. 24 tablet 3   loratadine (CLARITIN) 10 MG tablet Take 10 mg by mouth daily as needed for allergies.     MAGNESIUM PO Take 2 tablets by mouth daily at 6 (six) AM.     Multiple Vitamins-Calcium (ONE-A-DAY WOMENS FORMULA) TABS Take 1 tablet by mouth daily.     Prasterone, DHEA, (DHEA PO) Take 5 mg by mouth daily at 6 (six) AM.     VITAMIN D PO Take 125 mg by mouth daily at 6 (six) AM.     Current Facility-Administered Medications  Medication Dose Route Frequency Provider Last Rate Last Admin   0.9 %  sodium chloride infusion  500 mL Intravenous Once Mauri Pole, MD        Allergies as of 02/01/2023   (No Known Allergies)    Family History  Problem Relation Age of Onset   Colon polyps Father 59   Cancer Maternal Grandmother    Colon cancer Neg Hx    Esophageal cancer Neg Hx    Rectal cancer Neg Hx    Stomach cancer Neg Hx    Crohn's disease Neg  Hx    Ulcerative colitis Neg Hx     Social History   Socioeconomic History   Marital status: Married    Spouse name: Not on file   Number of children: Not on file   Years of education: Not on file   Highest education level: Not on file  Occupational History   Not on file  Tobacco Use   Smoking status: Never   Smokeless tobacco: Never  Vaping Use   Vaping Use: Never used  Substance and Sexual Activity   Alcohol use: Yes    Alcohol/week: 2.0 - 3.0 standard drinks of alcohol    Types: 2 - 3 Standard drinks or equivalent per week   Drug use: No   Sexual activity: Yes    Birth control/protection: Post-menopausal  Other Topics Concern   Not on file  Social History Narrative   Not on file   Social Determinants of Health   Financial Resource  Strain: Not on file  Food Insecurity: Not on file  Transportation Needs: Not on file  Physical Activity: Not on file  Stress: Not on file  Social Connections: Not on file  Intimate Partner Violence: Not on file    Review of Systems:  All other review of systems negative except as mentioned in the HPI.  Physical Exam: Vital signs in last 24 hours: Blood Pressure 106/63   Pulse (Abnormal) 103   Temperature 97.8 F (36.6 C)   Respiration 12   Height '5\' 2"'$  (1.575 m)   Weight 140 lb (63.5 kg)   Last Menstrual Period 11/29/2015   Oxygen Saturation 100%   Body Mass Index 25.61 kg/m  General:   Alert, NAD Lungs:  Clear .   Heart:  Regular rate and rhythm Abdomen:  Soft, nontender and nondistended. Neuro/Psych:  Alert and cooperative. Normal mood and affect. A and O x 3  Reviewed labs, radiology imaging, old records and pertinent past GI work up  Patient is appropriate for planned procedure(s) and anesthesia in an ambulatory setting   K. Denzil Magnuson , MD [phone]

## 2023-02-02 ENCOUNTER — Telehealth: Payer: Self-pay

## 2023-02-02 NOTE — Telephone Encounter (Signed)
Left message on follow up call. 

## 2023-02-09 ENCOUNTER — Encounter: Payer: Self-pay | Admitting: Gastroenterology

## 2023-09-25 ENCOUNTER — Other Ambulatory Visit: Payer: Self-pay | Admitting: Obstetrics and Gynecology

## 2023-09-25 DIAGNOSIS — Z1231 Encounter for screening mammogram for malignant neoplasm of breast: Secondary | ICD-10-CM

## 2023-11-09 ENCOUNTER — Ambulatory Visit
Admission: RE | Admit: 2023-11-09 | Discharge: 2023-11-09 | Disposition: A | Discharge status: Home / Self Care | Payer: BC Managed Care – PPO | Source: Ambulatory Visit | Attending: Obstetrics and Gynecology | Admitting: Obstetrics and Gynecology

## 2023-11-09 ENCOUNTER — Other Ambulatory Visit: Payer: Self-pay | Admitting: Obstetrics and Gynecology

## 2023-11-09 DIAGNOSIS — N952 Postmenopausal atrophic vaginitis: Secondary | ICD-10-CM

## 2023-11-09 DIAGNOSIS — Z1231 Encounter for screening mammogram for malignant neoplasm of breast: Secondary | ICD-10-CM

## 2023-11-09 NOTE — Telephone Encounter (Signed)
Med refill request: Estradiol Last AEX: 10/04/22 Next AEX: 12/18/23 Last MMG (if hormonal med) 11/09/23 Refill authorized: Please Advise, #24, 1 RF

## 2023-12-05 NOTE — Progress Notes (Signed)
48 y.o. [id number] Married Caucasian female here for annual exam.    Not sleeping as well.  Sleeps better when she exercises.   Using Vagifem.  Sex can still be uncomfortable.   PCP: Cleatis Polka., MD   Patient's last menstrual period was 11/29/2015.           Sexually active: Yes.    The current method of family planning is post menopausal status.    Menopausal hormone therapy:  estradiol Exercising: Yes.     Weight lifiting 2x a week, tennis 2x a week Smoker:  no  OB History  Gravida Para Term Preterm AB Living  4 2 2  2 2   SAB IAB Ectopic Multiple Live Births  2        # Outcome Date GA Lbr Len/2nd Weight Sex Type Anes PTL Lv  4 SAB           3 SAB           2 Term           1 Term              HEALTH MAINTENANCE: Last 2 paps:  08/28/19 neg: HR HPV neg, 01/24/12 neg History of abnormal Pap or positive HPV:  no Mammogram:   11/09/23 Breast Density Cat B, BI-RADS CAT 1 neg Colonoscopy:  02/01/23  due in 7 years.  Bone Density:  09/27/16  Result  normal   Immunization History  Administered Date(s) Administered   Influenza Split 09/24/2014   Influenza-Unspecified 08/28/2014, 08/28/2016   PFIZER(Purple Top)SARS-COV-2 Vaccination 02/08/2020, 03/03/2020  Received her flu vaccine. Up to date on tetanus with PCP, about 3 years ago.    reports that she has never smoked. She has never used smokeless tobacco. She reports current alcohol use of about 2.0 - 3.0 standard drinks of alcohol per week. She reports that she does not use drugs.  Past Medical History:  Diagnosis Date   COVID 11/2020   Headache(784.0)    Hyperlipidemia    diet controlled   Premature menopause    age 69    Past Surgical History:  Procedure Laterality Date   BREAST BIOPSY Left    CESAREAN SECTION  2005   and 2008 x2   DILATION AND CURETTAGE OF UTERUS  2007   DILATION AND EVACUATION  11/25/2011   Procedure: DILATATION AND EVACUATION;  Surgeon: Philip Aspen, DO;  Location: [facility];   Service: Gynecology;  Laterality: N/A;  dilatation and curettage   LAPAROSCOPIC APPENDECTOMY N/A 10/27/2017   Procedure: APPENDECTOMY LAPAROSCOPIC;  Surgeon: Axel Filler, MD;  Location: [facility];  Service: General;  Laterality: N/A;   WISDOM TOOTH EXTRACTION      Current Outpatient Medications  Medication Sig Dispense Refill   Estradiol 10 MCG TABS vaginal tablet INSERT 1 TABLET(10 MCG) VAGINALLY 2 TIMES A WEEK 24 tablet 0   loratadine (CLARITIN) 10 MG tablet Take 10 mg by mouth daily as needed for allergies.     MAGNESIUM PO Take 2 tablets by mouth daily at 6 (six) AM.     Multiple Vitamins-Calcium (ONE-A-DAY WOMENS FORMULA) TABS Take 1 tablet by mouth daily.     Prasterone, DHEA, (DHEA PO) Take 5 mg by mouth daily at 6 (six) AM.     VITAMIN D PO Take 125 mg by mouth daily at 6 (six) AM.     No current facility-administered medications for this visit.    ALLERGIES: Patient has no known allergies.  Family History  Problem Relation Age of Onset   Colon polyps Father 20   Cancer Maternal Grandmother    Colon cancer Neg Hx    Esophageal cancer Neg Hx    Rectal cancer Neg Hx    Stomach cancer Neg Hx    Crohn's disease Neg Hx    Ulcerative colitis Neg Hx     Review of Systems  All other systems reviewed and are negative.   PHYSICAL EXAM:  BP 124/84 (BP Location: Right Arm, Patient Position: Sitting, Cuff Size: Small)   Pulse 88   Ht 5' 2.25" (1.581 m)   Wt 142 lb (64.4 kg)   LMP 11/29/2015   SpO2 98%   BMI 25.76 kg/m     General appearance: alert, cooperative and appears stated age Head: normocephalic, without obvious abnormality, atraumatic Neck: no adenopathy, supple, symmetrical, trachea midline and thyroid normal to inspection and palpation Lungs: clear to auscultation bilaterally Breasts: normal appearance, no masses or tenderness, No nipple retraction or dimpling, No nipple discharge or bleeding, No axillary adenopathy Heart: regular rate and rhythm Abdomen:  soft, non-tender; no masses, no organomegaly Extremities: extremities normal, atraumatic, no cyanosis or edema Skin: skin color, texture, turgor normal. No rashes or lesions Lymph nodes: cervical, supraclavicular, and axillary nodes normal. Neurologic: grossly normal  Pelvic: External genitalia:  no lesions              No abnormal inguinal nodes palpated.              Urethra:  normal appearing urethra with no masses, tenderness or lesions              Bartholins and Skenes: normal                 Vagina: normal appearing vagina with normal color and discharge, no lesions              Cervix: no lesions              Pap taken: No. Bimanual Exam:  Uterus:  normal size, contour, position, consistency, mobility, non-tender              Adnexa: no mass, fullness, tenderness              Rectal exam: Yes.  .  Confirms.              Anus:  normal sphincter tone, no lesions  Chaperone was present for exam:  Warren Lacy, CMA  ASSESSMENT: Well woman visit with gynecologic exam  Premature menopause. Negative work up at [facility].               Off HRT.              Vaginal atrophy.  Encounter for medication monitoring. Sleep disturbance.  PLAN: Mammogram screening discussed. Self breast awareness reviewed. Pap and HRV collected:  No.  Due in 2025.  Guidelines for Calcium, Vitamin D, regular exercise program including cardiovascular and weight bearing exercise. Medication refills:  Vagifem.  We discussed alternatives being vaginal estrogen cream or vaginal estradiol ring, both declined.  We discussed potential effect of vaginal estrogens on breast cancer. Labs with PCP.  Try melatonin for sleep. Follow up:  1 year and prn.

## 2023-12-18 ENCOUNTER — Encounter: Payer: Self-pay | Admitting: Obstetrics and Gynecology

## 2023-12-18 ENCOUNTER — Ambulatory Visit (INDEPENDENT_AMBULATORY_CARE_PROVIDER_SITE_OTHER): Payer: BC Managed Care – PPO | Admitting: Obstetrics and Gynecology

## 2023-12-18 VITALS — BP 124/84 | HR 88 | Ht 62.25 in | Wt 142.0 lb

## 2023-12-18 DIAGNOSIS — N952 Postmenopausal atrophic vaginitis: Secondary | ICD-10-CM

## 2023-12-18 DIAGNOSIS — Z5181 Encounter for therapeutic drug level monitoring: Secondary | ICD-10-CM | POA: Diagnosis not present

## 2023-12-18 DIAGNOSIS — Z01419 Encounter for gynecological examination (general) (routine) without abnormal findings: Secondary | ICD-10-CM

## 2023-12-18 MED ORDER — ESTRADIOL 10 MCG VA TABS: 10.0000 ug | ORAL_TABLET | VAGINAL | 3 refills | Status: DC

## 2023-12-18 NOTE — Patient Instructions (Signed)

## 2024-09-25 ENCOUNTER — Other Ambulatory Visit: Payer: Self-pay | Admitting: Obstetrics and Gynecology

## 2024-09-25 DIAGNOSIS — Z1231 Encounter for screening mammogram for malignant neoplasm of breast: Secondary | ICD-10-CM

## 2024-11-11 ENCOUNTER — Ambulatory Visit

## 2024-11-11 ENCOUNTER — Ambulatory Visit
Admission: RE | Admit: 2024-11-11 | Discharge: 2024-11-11 | Disposition: A | Source: Ambulatory Visit | Attending: Obstetrics and Gynecology | Admitting: Obstetrics and Gynecology

## 2024-11-11 DIAGNOSIS — Z1231 Encounter for screening mammogram for malignant neoplasm of breast: Secondary | ICD-10-CM

## 2024-11-15 ENCOUNTER — Other Ambulatory Visit: Payer: Self-pay | Admitting: Obstetrics and Gynecology

## 2024-11-15 ENCOUNTER — Ambulatory Visit: Payer: Self-pay | Admitting: Obstetrics and Gynecology

## 2024-11-15 DIAGNOSIS — R928 Other abnormal and inconclusive findings on diagnostic imaging of breast: Secondary | ICD-10-CM

## 2024-11-29 ENCOUNTER — Ambulatory Visit
Admission: RE | Admit: 2024-11-29 | Discharge: 2024-11-29 | Disposition: A | Discharge status: Home / Self Care | Source: Ambulatory Visit | Attending: Obstetrics and Gynecology | Admitting: Obstetrics and Gynecology

## 2024-11-29 DIAGNOSIS — R928 Other abnormal and inconclusive findings on diagnostic imaging of breast: Secondary | ICD-10-CM

## 2024-12-01 ENCOUNTER — Ambulatory Visit: Payer: Self-pay | Admitting: Obstetrics and Gynecology

## 2024-12-25 NOTE — Progress Notes (Signed)
 "  49 y.o. [id number] Married Caucasian female here for annual exam. Having trouble sleeping.    Occasionally feeling warm.     Taking magnesium.  Using Vagifem .    PCP: Loreli Elsie JONETTA Mickey., MD   Patient's last menstrual period was 11/29/2015.           Sexually active: Yes.    The current method of family planning is post menopausal status.    Menopausal hormone therapy:  Estradiol   Exercising: Yes.    Weight training and walking  Smoker:  no  OB History  Gravida Para Term Preterm AB Living  4 2 2  2 2   SAB IAB Ectopic Multiple Live Births  2        # Outcome Date GA Lbr Len/2nd Weight Sex Type Anes PTL Lv  4 SAB           3 SAB           2 Term           1 Term              HEALTH MAINTENANCE: Last 2 paps:  08/28/19 neg, HR HPV neg, 01/24/12 neg  History of abnormal Pap or positive HPV:  no Mammogram:   11/29/24 Breast Density Cat B, BIRADS Cat 2 benign  Colonoscopy:  02/01/23 due in 7 years  Bone Density:  09/27/16  Result  normal    Immunization History  Administered Date(s) Administered   Influenza Split 09/24/2014   Influenza-Unspecified 08/28/2014, 08/28/2016   PFIZER(Purple Top)SARS-COV-2 Vaccination 02/08/2020, 03/03/2020      reports that she has never smoked. She has never used smokeless tobacco. She reports current alcohol  use of about 2.0 - 3.0 standard drinks of alcohol  per week. She reports that she does not use drugs.  Past Medical History:  Diagnosis Date   COVID 11/2020   Headache(784.0)    Hyperlipidemia    diet controlled   Premature menopause    age 47    Past Surgical History:  Procedure Laterality Date   BREAST BIOPSY Left    CESAREAN SECTION  2005   and 2008 x2   DILATION AND CURETTAGE OF UTERUS  2007   DILATION AND EVACUATION  11/25/2011   Procedure: DILATATION AND EVACUATION;  Surgeon: Donna Just, DO;  Location: [facility];  Service: Gynecology;  Laterality: N/A;  dilatation and curettage   LAPAROSCOPIC APPENDECTOMY N/A 10/27/2017    Procedure: APPENDECTOMY LAPAROSCOPIC;  Surgeon: Rubin Calamity, MD;  Location: [facility];  Service: General;  Laterality: N/A;   SKIN CANCER EXCISION  09/2024   Nose   WISDOM TOOTH EXTRACTION      Current Outpatient Medications  Medication Sig Dispense Refill   Estradiol  10 MCG TABS vaginal tablet Place 1 tablet (10 mcg total) vaginally 2 (two) times a week. Place in the vagina at bedtime. 24 tablet 3   loratadine (CLARITIN) 10 MG tablet Take 10 mg by mouth daily as needed for allergies.     loteprednol (LOTEMAX) 0.5 % ophthalmic suspension 1 drop 4 (four) times daily.     MAGNESIUM PO Take 2 tablets by mouth daily at 6 (six) AM.     Multiple Vitamins-Calcium (ONE-A-DAY WOMENS FORMULA) TABS Take 1 tablet by mouth daily.     NURTEC 75 MG TBDP 1 tablet on the tongue and allow to dissolve Orally as needed for Migraine; Duration: 30 days     Prasterone, DHEA, (DHEA PO) Take 5 mg by mouth daily at  6 (six) AM.     SUMAtriptan (IMITREX) 100 MG tablet 1 tablet at least 2 hours between doses as needed Orally Twice a day; Duration: 30 days As needed migraines     VITAMIN D PO Take 125 mg by mouth daily at 6 (six) AM.     No current facility-administered medications for this visit.    ALLERGIES: Patient has no known allergies.  Family History  Problem Relation Age of Onset   Colon polyps Father 46   Cancer Maternal Grandmother    Colon cancer Neg Hx    Esophageal cancer Neg Hx    Rectal cancer Neg Hx    Stomach cancer Neg Hx    Crohn's disease Neg Hx    Ulcerative colitis Neg Hx     Review of Systems  All other systems reviewed and are negative.   PHYSICAL EXAM:  BP 124/82 (BP Location: Left Arm, Patient Position: Sitting)   Pulse (!) 115   Ht 5' 4 (1.626 m)   Wt 149 lb (67.6 kg)   LMP 11/29/2015   SpO2 100%   BMI 25.58 kg/m     General appearance: alert, cooperative and appears stated age Head: normocephalic, without obvious abnormality, atraumatic Neck: no adenopathy,  supple, symmetrical, trachea midline and thyroid  normal to inspection and palpation Lungs: clear to auscultation bilaterally Breasts: normal appearance, no masses or tenderness, No nipple retraction or dimpling, No nipple discharge or bleeding, No axillary adenopathy Heart: regular rate and rhythm Abdomen: soft, non-tender; no masses, no organomegaly Extremities: extremities normal, atraumatic, no cyanosis or edema Skin: skin color, texture, turgor normal. No rashes or lesions Lymph nodes: cervical, supraclavicular, and axillary nodes normal. Neurologic: grossly normal  Pelvic: External genitalia:  no lesions              No abnormal inguinal nodes palpated.              Urethra:  normal appearing urethra with no masses, tenderness or lesions              Bartholins and Skenes: normal                 Vagina: normal appearing vagina with normal color and discharge, no lesions              Cervix: no lesions              Pap taken: yes Bimanual Exam:  Uterus:  normal size, contour, position, consistency, mobility, non-tender              Adnexa: no mass, fullness, tenderness              Rectal exam: yes.  Confirms.              Anus:  normal sphincter tone, no lesions  Chaperone was present for exam:  Kari HERO, CMA  ASSESSMENT: Well woman visit with gynecologic exam. Premature menopause. Negative work up at [facility].   Off HRT.  Vaginal atrophy.  Using Vagifem .  Cervical cancer screening.   PHQ-2-9: 0  PLAN: Mammogram screening discussed. Self breast awareness reviewed. Pap and HRV collected:  yes Guidelines for Calcium, Vitamin D, regular exercise program including cardiovascular and weight bearing exercise. Medication refills:  Vagifem  10 mcg pv at hs twice weekly, #24, RF 3. We discussed tx options for menopause.  HRT, Gabapentin, Paxil/Effexor, Vezoah.  No Rx given today. Consider Melatonin for sleep. Labs with PCP.   Dexa next year.  Follow up:  yearly and prn.              "

## 2024-12-26 ENCOUNTER — Ambulatory Visit: Payer: BC Managed Care – PPO | Admitting: Obstetrics and Gynecology

## 2024-12-26 ENCOUNTER — Other Ambulatory Visit (HOSPITAL_COMMUNITY)
Admission: RE | Admit: 2024-12-26 | Discharge: 2024-12-26 | Disposition: A | Source: Ambulatory Visit | Attending: Obstetrics and Gynecology | Admitting: Obstetrics and Gynecology

## 2024-12-26 ENCOUNTER — Encounter: Payer: Self-pay | Admitting: Obstetrics and Gynecology

## 2024-12-26 VITALS — BP 124/82 | HR 115 | Ht 64.0 in | Wt 149.0 lb

## 2024-12-26 DIAGNOSIS — Z1331 Encounter for screening for depression: Secondary | ICD-10-CM | POA: Diagnosis not present

## 2024-12-26 DIAGNOSIS — N952 Postmenopausal atrophic vaginitis: Secondary | ICD-10-CM | POA: Diagnosis not present

## 2024-12-26 DIAGNOSIS — Z124 Encounter for screening for malignant neoplasm of cervix: Secondary | ICD-10-CM | POA: Insufficient documentation

## 2024-12-26 DIAGNOSIS — Z01419 Encounter for gynecological examination (general) (routine) without abnormal findings: Secondary | ICD-10-CM

## 2024-12-26 MED ORDER — ESTRADIOL 10 MCG VA TABS: 10.0000 ug | ORAL_TABLET | VAGINAL | 3 refills | Status: AC

## 2024-12-26 NOTE — Patient Instructions (Signed)

## 2024-12-27 LAB — CYTOLOGY - PAP
Adequacy: ABSENT
Comment: NEGATIVE
Diagnosis: NEGATIVE
High risk HPV: NEGATIVE

## 2024-12-30 ENCOUNTER — Ambulatory Visit: Payer: Self-pay | Admitting: Obstetrics and Gynecology

## 2025-12-31 ENCOUNTER — Ambulatory Visit: Admitting: Obstetrics and Gynecology
